# Patient Record
Sex: Female | Born: 1980
Health system: Southern US, Community
[De-identification: ages and names within clinical notes are randomized; demographics above are authoritative.]

## PROBLEM LIST (undated history)

## (undated) DIAGNOSIS — B019 Varicella without complication: Secondary | ICD-10-CM

## (undated) DIAGNOSIS — K219 Gastro-esophageal reflux disease without esophagitis: Secondary | ICD-10-CM

## (undated) DIAGNOSIS — J45909 Unspecified asthma, uncomplicated: Secondary | ICD-10-CM

## (undated) HISTORY — DX: Unspecified asthma, uncomplicated: J45.909

## (undated) HISTORY — DX: Gastro-esophageal reflux disease without esophagitis: K21.9

## (undated) HISTORY — DX: Varicella without complication: B01.9

---

## 2003-08-07 ENCOUNTER — Other Ambulatory Visit: Admission: RE | Admit: 2003-08-07 | Discharge: 2003-08-07 | Payer: Self-pay | Admitting: Obstetrics and Gynecology

## 2004-07-17 ENCOUNTER — Other Ambulatory Visit: Admission: RE | Admit: 2004-07-17 | Discharge: 2004-07-17 | Payer: Self-pay | Admitting: Obstetrics and Gynecology

## 2005-01-28 ENCOUNTER — Other Ambulatory Visit: Admission: RE | Admit: 2005-01-28 | Discharge: 2005-01-28 | Payer: Self-pay | Admitting: Obstetrics and Gynecology

## 2005-07-01 ENCOUNTER — Other Ambulatory Visit: Admission: RE | Admit: 2005-07-01 | Discharge: 2005-07-01 | Payer: Self-pay | Admitting: Obstetrics and Gynecology

## 2005-11-25 ENCOUNTER — Other Ambulatory Visit: Admission: RE | Admit: 2005-11-25 | Discharge: 2005-11-25 | Payer: Self-pay | Admitting: Obstetrics and Gynecology

## 2006-03-24 ENCOUNTER — Ambulatory Visit (HOSPITAL_COMMUNITY): Admission: RE | Admit: 2006-03-24 | Discharge: 2006-03-24 | Payer: Self-pay | Admitting: Obstetrics & Gynecology

## 2006-06-29 ENCOUNTER — Inpatient Hospital Stay (HOSPITAL_COMMUNITY): Admission: AD | Admit: 2006-06-29 | Discharge: 2006-07-03 | Payer: Self-pay | Admitting: Obstetrics and Gynecology

## 2008-11-03 ENCOUNTER — Encounter (INDEPENDENT_AMBULATORY_CARE_PROVIDER_SITE_OTHER): Payer: Self-pay | Admitting: *Deleted

## 2008-12-06 ENCOUNTER — Ambulatory Visit: Payer: Self-pay | Admitting: Family Medicine

## 2009-12-03 ENCOUNTER — Inpatient Hospital Stay (HOSPITAL_COMMUNITY): Admission: RE | Admit: 2009-12-03 | Discharge: 2009-12-06 | Payer: Self-pay | Admitting: Obstetrics and Gynecology

## 2009-12-03 ENCOUNTER — Encounter (INDEPENDENT_AMBULATORY_CARE_PROVIDER_SITE_OTHER): Payer: Self-pay | Admitting: Obstetrics and Gynecology

## 2010-10-15 NOTE — Letter (Signed)
Summary: New Patient Letter  [facility]  [address] [place], [place] [address]   Phone: [phone]  Fax: [phone]       11/03/2008 MRN: [id number]  Angela Cooley [address] [place], [place]  [address]  Dear [name],   Welcome to [facility] and thank you for choosing [place] as your Primary Care Providers. Enclosed you will find information about our practice that we hope you find helpful. We have also enclosed forms to be filled out prior to your visit. This will provide [place] with the necessary information and facilitate your being seen in a timely manner. If you have any questions, please call us at:  [phone]    and we will be happy to assist you. We look forward to seeing you at your scheduled appointment time.  Appointment   12-06-08 @8 :00      with Dr.    Neena Rhymes     Sincerely,  Primary Health Care Team  Please arrive 15 minutes early for your first appointment and bring your insurance card. Co-pay is required at the time of your visit.  *****Please call the office if you are not able to keep this appointment. There is a charge of $50.00 if any appointment is not cancelled or rescheduled within 24 hours.

## 2010-11-15 ENCOUNTER — Encounter: Payer: Self-pay | Admitting: Family Medicine

## 2010-11-15 ENCOUNTER — Ambulatory Visit (INDEPENDENT_AMBULATORY_CARE_PROVIDER_SITE_OTHER): Payer: BC Managed Care – PPO | Admitting: Family Medicine

## 2010-11-15 DIAGNOSIS — K219 Gastro-esophageal reflux disease without esophagitis: Secondary | ICD-10-CM

## 2010-11-15 DIAGNOSIS — R079 Chest pain, unspecified: Secondary | ICD-10-CM | POA: Insufficient documentation

## 2010-11-15 HISTORY — DX: Gastro-esophageal reflux disease without esophagitis: K21.9

## 2010-11-15 HISTORY — DX: Chest pain, unspecified: R07.9

## 2010-11-18 LAB — CONVERTED CEMR LAB

## 2010-11-19 ENCOUNTER — Other Ambulatory Visit: Payer: BC Managed Care – PPO

## 2010-11-25 ENCOUNTER — Other Ambulatory Visit (INDEPENDENT_AMBULATORY_CARE_PROVIDER_SITE_OTHER): Payer: BC Managed Care – PPO

## 2010-11-25 ENCOUNTER — Other Ambulatory Visit: Payer: Self-pay | Admitting: Family Medicine

## 2010-11-25 ENCOUNTER — Encounter (INDEPENDENT_AMBULATORY_CARE_PROVIDER_SITE_OTHER): Payer: Self-pay | Admitting: *Deleted

## 2010-11-25 DIAGNOSIS — K219 Gastro-esophageal reflux disease without esophagitis: Secondary | ICD-10-CM

## 2010-11-26 LAB — H. PYLORI ANTIBODY, IGG: H Pylori IgG: NEGATIVE

## 2010-11-26 NOTE — Assessment & Plan Note (Signed)
Summary: Office Visit: burning stomach after eating   Vital Signs:  Patient profile:   30 year old female Height:      63 inches Weight:      166 pounds Temp:     98.1 degrees F oral Pulse rate:   74 / minute BP sitting:   100 / 62  (left arm)  Vitals Entered By: Jeremy Johann CMA (November 15, 2010 4:21 PM) CC: burning, sour stomach feel when eating certain foods   History of Present Illness: 30 yo woman here today for 'stomach issues'.  started after having 2nd child (3/11).  eggs will particularly upset stomach.  some salads.  will seem to 'flare'.  tried Zantac OTC w/ relief.  attempting weight loss- 'i need to be able to eat these things'.  + nausea, some vomiting.  pain is epigastric, described as a 'burning'.  denies sour brash.  does not change w/ position change.  also reports pain on L chest wall w/ exercise- particularly if there is a lot of jumping or bouncing.  reports her sports bras are old and worn out.  after bouncing will also have pain when she leans forward.  denies pressure, SOB, N/V.  Current Medications (verified): 1)  Zantac 75 75 Mg Tabs (Ranitidine Hcl) .Marland Kitchen.. 1 Tab By Mouth Two Times A Day  Allergies (verified): No Known Drug Allergies  Past History:  Past Medical History: GERD  Review of Systems      See HPI  Physical Exam  General:  Well-developed,well-nourished,in no acute distress; alert,appropriate and cooperative throughout examination Mouth:  Oral mucosa and oropharynx without lesions or exudates.  Teeth in good repair. Neck:  No deformities, masses, or tenderness noted. Chest Wall:  No deformities, masses, or tenderness noted. Lungs:  Normal respiratory effort, chest expands symmetrically. Lungs are clear to auscultation, no crackles or wheezes. Heart:  Normal rate and regular rhythm. S1 and S2 normal without gallop, murmur, click, rub or other extra sounds. Abdomen:  Bowel sounds positive,abdomen soft and non-tender without masses,  organomegaly or hernias noted.   Impression & Recommendations:  Problem # 1:  GERD (ICD-530.81) Assessment New pt's sxs consistent w/ GERD.  gets good relief from Zantac.  was afraid to take meds daily due to packaging info.  discussed lifestyle modifications and reassured her that she was safe to take the meds daily if needed. Her updated medication list for this problem includes:    Zantac 75 75 Mg Tabs (Ranitidine hcl) .Marland Kitchen... 1 tab by mouth two times a day  Orders: Venipuncture (95284) TLB-H. Pylori Abs(Helicobacter Pylori) (86677-HELICO) Specimen Handling (13244)  Problem # 2:  CHEST PAIN (ICD-786.50) Assessment: New pt's pain is most likely pain from breast suspensory ligaments being stretched during exercise.  no concern for cardiac pain.  encouraged better sports bra  Complete Medication List: 1)  Zantac 75 75 Mg Tabs (Ranitidine hcl) .Marland Kitchen.. 1 tab by mouth two times a day  Patient Instructions: 1)  Take the Zanctac daily as directed for heart burn 2)  We'll notify you of your lab results 3)  Treat yourself to a new sports bra! 4)  Ibuprofen for your chest wall pain 5)  Hang in there!!! Prescriptions: ZANTAC 75 75 MG TABS (RANITIDINE HCL) 1 tab by mouth two times a day  #60 x 6   Entered and Authorized by:   Neena Rhymes MD   Signed by:   Neena Rhymes MD on 11/15/2010   Method used:   Print then  Give to Patient   RxID:   [id number]    Orders Added: 1)  Venipuncture [44010] 2)  TLB-H. Pylori Abs(Helicobacter Pylori) [86677-HELICO] 3)  Specimen Handling [99000] 4)  Est. Patient Level III [27253]

## 2010-12-08 LAB — CBC
HCT: 26.7 % — ABNORMAL LOW (ref 36.0–46.0)
HCT: 30.7 % — ABNORMAL LOW (ref 36.0–46.0)
Hemoglobin: 10.4 g/dL — ABNORMAL LOW (ref 12.0–15.0)
Hemoglobin: 9 g/dL — ABNORMAL LOW (ref 12.0–15.0)
MCHC: 33.6 g/dL (ref 30.0–36.0)
MCHC: 34.1 g/dL (ref 30.0–36.0)
MCV: 83.5 fL (ref 78.0–100.0)
MCV: 84.9 fL (ref 78.0–100.0)
Platelets: 155 10*3/uL (ref 150–400)
Platelets: 192 10*3/uL (ref 150–400)
RBC: 3.15 MIL/uL — ABNORMAL LOW (ref 3.87–5.11)
RBC: 3.67 MIL/uL — ABNORMAL LOW (ref 3.87–5.11)
RDW: 14.3 % (ref 11.5–15.5)
RDW: 14.5 % (ref 11.5–15.5)
WBC: 12.1 10*3/uL — ABNORMAL HIGH (ref 4.0–10.5)
WBC: 8.2 10*3/uL (ref 4.0–10.5)

## 2010-12-08 LAB — RPR: RPR Ser Ql: NONREACTIVE

## 2010-12-08 LAB — CCBB MATERNAL DONOR DRAW

## 2011-01-31 NOTE — Discharge Summary (Signed)
NAMEJNIYAH, DANTUONO                  ACCOUNT NO.:  [id number]   MEDICAL RECORD NO.:  [id number]          PATIENT TYPE:  INP   LOCATION:  9126                          FACILITY:  [facility]   PHYSICIAN:  Michelle L. Grewal, M.D.DATE OF BIRTH:  Jan 24, 1981   DATE OF ADMISSION:  06/29/2006  DATE OF DISCHARGE:  07/03/2006                                 DISCHARGE SUMMARY   ADMITTING DIAGNOSES:  1. Intrauterine pregnancy at 41-1/7 weeks estimated gestational age.  2. Two-stage induction of labor.   DISCHARGE DIAGNOSES:  1. Status post low transverse cesarean section secondary to failed vacuum      extractor and arrest of descent.  2. Viable female infant.   PROCEDURE:  Primary low transverse cesarean section.   REASON FOR ADMISSION:  Please see written H&P.  The patient is 30 year old  white married female primigravida that was admitted to [facility] for two-stage induction of labor.  On admission that evening the  patient was administered Cytotec for ripening of the cervix.  She had been  seen in the office and noted to be 2 cm dilated, 90% effaced, vertex at -1  station.  Fetal heart tones were reactive.  On the following morning the  patient was comfortable, fetal heart tones were reactive.  Contractions was  very irregular.  Cervix was now dilated 2.5 cm, 90% effaced, vertex at a -2  station.  Artificial rupture of membranes was performed which revealed clear  fluid.  Pitocin was started to augment her labor and antibiotics were  administered.  Later that morning, the patient did request epidural for her  comfort.  Vital signs were stable.  Fetal heart tones were reactive.  The  fetus did have a small bradycardia after the epidural, however, quickly  reactive.  Cervix was reexamined later that afternoon and was noted to be 4  cm dilated, completely effaced, vertex remaining at a -2 station. A uterine  pressure catheter was placed to adequate follow her labor pattern.   Pitocin  continued.  Later that evening the patient did achieve full dilatation.  After pushing for approximately 2 hours with little gain in station, vacuum  extractor was placed and the vertex was at a +2/+3 station.  Vertex was  noted to be in occiput posterior position.  After several attempts without  pop offs there was no further gain in station.  Decision was made to proceed  with a primary low transverse cesarean section.  The patient was then  transferred to the operating room where epidural was dosed to an adequate  surgical level.  Low transverse incision was made with delivery of a viable  female infant weighing 7 pounds 8 ounces with Apgars of 8 at 1 minute and 9  at 5 minutes.  Arterial cord pH was 7.36.  The patient tolerated the  procedure well and was taken to the recovery room in stable condition.   The following morning the patient did complain of some nausea.  Vital signs  stable.  She was afebrile.  Abdomen soft.  Fundus firm and nontender.  Abdominal dressing was noted to be clean, dry and intact.  Laboratory  findings revealed hemoglobin of 11.2.   On postoperative day #2, the patient was without complaint other than  soreness.  Vital signs were stable.  She was afebrile.  Abdomen soft with  good return of bowel function.  Fundus was firm and nontender.  Abdominal  dressing was noted to be clean, dry and intact.   On postoperative day #3, the patient was without complaint.  Vital signs  remained stable.  Fundus firm and nontender.  Incision was clean, dry and  intact.  Staples removed and patient was later discharged home.   CONDITION ON DISCHARGE:  Good.   DIET:  Regular as tolerated.   ACTIVITY:  No heavy lifting, no driving x2 weeks.  No vaginal entry.   FOLLOW UP:  Patient to follow up in the office in 1 week for an incision  check.  She is to call for temperature greater than 100 degrees, persistent  nausea, vomiting, heavy vaginal bleeding and/or  redness or drainage from  incisional site.   DISCHARGE MEDICATIONS:  1. Tylox #30 one p.o. q.4-6h. p.r.n.  2. Motrin 600 mg every 6 hours.  3. Prenatal vitamins one p.o. daily.  4. Colace one p.o. daily p.r.n.      Julio Sicks, N.P.      Stann Mainland. Vincente Poli, M.D.  Electronically Signed    CC/MEDQ  D:  07/20/2006  T:  07/20/2006  Job:  [id number]

## 2011-01-31 NOTE — Op Note (Signed)
Angela Cooley, Angela Cooley                  ACCOUNT NO.:  [id number]   MEDICAL RECORD NO.:  [id number]          PATIENT TYPE:  INP   LOCATION:  9126                          FACILITY:  [facility]   PHYSICIAN:  Michelle L. Grewal, M.D.DATE OF BIRTH:  1981/01/23   DATE OF PROCEDURE:  06/30/2006  DATE OF DISCHARGE:                                 OPERATIVE REPORT   PREOPERATIVE DIAGNOSIS:  Intrauterine pregnancy at 41 weeks,  occipitoposterior position, failed vacuum and arrest of descent.   POSTOPERATIVE DIAGNOSIS:  Intrauterine pregnancy at 41 weeks,  occipitoposterior position, failed vacuum and arrest of descent.   PROCEDURE:  Primary low transverse cesarean section.   SURGEON:  Michelle L. Vincente Poli, M.D.   ANESTHESIA:  Epidural.   SPECIMENS:  Cord pH 7.36.  Female infant in OP position, Apgar scores 9 at 1  minute and 9 at 5 minutes, weight of 7 pounds 8 ounces, body cord x1.   ESTIMATED BLOOD LOSS:  500 mL.   COMPLICATIONS:  None.   PROCEDURE:  Patient was taken to the operating room.  Her epidural was dosed  and found to be adequate.  She was prepped and draped.  A Foley catheter is  inserted and blood-tinged urine is noted; this was also noted in Labor and  Delivery while the patient was pushing.  A low transverse incision was made  and carried down to the fascia.  The fascia was scored in the midline and  extended laterally.  The rectus muscles were separated in the midline and  extended laterally.  The peritoneum was then stretched.  The bladder blade  was inserted and the lower segment was identified and a low transverse  incision was made in the uterus.  The uterus was entered with a hemostat.  Upon entry into the uterus, the baby's mouth was right that at the incision  and the head was very high.  The baby was in direct OP position and was  delivered quite easily and was noted to have a body cord x1.  The baby was a  female infant, Apgar scores of 9 at 1 minute and 9 at 5 minutes  and weighed  7 pounds 8 ounces.  After cord blood was obtained and cord pH was obtained,  which was 7.36, the placenta was manually removed and noted be intact and  normal with 3-vessel cord.  The uterus was exteriorized and cleared of all  clots and debris.  The uterine incision was closed in 1 layer using 0  chromic in a continuous running-locked stitch.  Hemostasis was excellent.  The uterus was returned to the abdomen.  Irrigation was performed;  hemostasis was again noted.  The peritoneum was closed using 0 Vicryl and  the rectus muscles were reapproximated using the same 0 Vicryl.  The fascia  was closed using 0 Vicryl,  starting at each corner and meeting in the midline using a continuous  running stitch.  After irrigation of the subcutaneous layer, the skin was  closed with staples.  All sponge, lap and instrument counts were correct x2.  The patient  went to recovery room in stable condition.      Michelle L. Vincente Poli, M.D.  Electronically Signed     MLG/MEDQ  D:  06/30/2006  T:  07/02/2006  Job:  [id number]

## 2012-04-26 ENCOUNTER — Other Ambulatory Visit: Payer: Self-pay | Admitting: Obstetrics and Gynecology

## 2012-09-10 ENCOUNTER — Encounter: Payer: Self-pay | Admitting: Family Medicine

## 2012-09-10 ENCOUNTER — Ambulatory Visit (INDEPENDENT_AMBULATORY_CARE_PROVIDER_SITE_OTHER): Payer: BC Managed Care – PPO | Admitting: Family Medicine

## 2012-09-10 VITALS — BP 100/62 | HR 56 | Temp 98.1°F | Ht 62.5 in | Wt 149.9 lb

## 2012-09-10 DIAGNOSIS — R1013 Epigastric pain: Secondary | ICD-10-CM

## 2012-09-10 LAB — CBC WITH DIFFERENTIAL/PLATELET
Basophils Absolute: 0 10*3/uL (ref 0.0–0.1)
Basophils Relative: 1 % (ref 0.0–3.0)
Eosinophils Absolute: 0.3 10*3/uL (ref 0.0–0.7)
Eosinophils Relative: 7.5 % — ABNORMAL HIGH (ref 0.0–5.0)
HCT: 38.8 % (ref 36.0–46.0)
Hemoglobin: 13.1 g/dL (ref 12.0–15.0)
Lymphocytes Relative: 30.5 % (ref 12.0–46.0)
Lymphs Abs: 1.4 10*3/uL (ref 0.7–4.0)
MCHC: 33.7 g/dL (ref 30.0–36.0)
MCV: 84.7 fl (ref 78.0–100.0)
Monocytes Absolute: 0.3 10*3/uL (ref 0.1–1.0)
Monocytes Relative: 7.1 % (ref 3.0–12.0)
Neutro Abs: 2.4 10*3/uL (ref 1.4–7.7)
Neutrophils Relative %: 53.9 % (ref 43.0–77.0)
Platelets: 219 10*3/uL (ref 150.0–400.0)
RBC: 4.58 Mil/uL (ref 3.87–5.11)
RDW: 13.6 % (ref 11.5–14.6)
WBC: 4.4 10*3/uL — ABNORMAL LOW (ref 4.5–10.5)

## 2012-09-10 LAB — AMYLASE: Amylase: 68 U/L (ref 27–131)

## 2012-09-10 LAB — HEPATIC FUNCTION PANEL
ALT: 25 U/L (ref 0–35)
AST: 24 U/L (ref 0–37)
Albumin: 4 g/dL (ref 3.5–5.2)
Alkaline Phosphatase: 56 U/L (ref 39–117)
Bilirubin, Direct: 0 mg/dL (ref 0.0–0.3)
Total Bilirubin: 0.6 mg/dL (ref 0.3–1.2)
Total Protein: 7.1 g/dL (ref 6.0–8.3)

## 2012-09-10 LAB — BASIC METABOLIC PANEL
BUN: 10 mg/dL (ref 6–23)
CO2: 24 mEq/L (ref 19–32)
Calcium: 9.3 mg/dL (ref 8.4–10.5)
Chloride: 104 mEq/L (ref 96–112)
Creatinine, Ser: 0.9 mg/dL (ref 0.4–1.2)
GFR: 79.65 mL/min (ref >60.00)
Glucose, Bld: 95 mg/dL (ref 70–99)
Potassium: 4.2 mEq/L (ref 3.5–5.1)
Sodium: 137 mEq/L (ref 135–145)

## 2012-09-10 LAB — LIPASE: Lipase: 22 U/L (ref 11.0–59.0)

## 2012-09-10 LAB — H. PYLORI ANTIBODY, IGG: H Pylori IgG: NEGATIVE

## 2012-09-10 MED ORDER — OMEPRAZOLE 40 MG PO CPDR: 40.0000 mg | DELAYED_RELEASE_CAPSULE | Freq: Every day | ORAL | Status: DC

## 2012-09-10 NOTE — Assessment & Plan Note (Signed)
New.  Check labs to r/o liver dysfxn, pancreatic abnormality, infxn, h pylori.  Start PPI daily.  Reviewed supportive care and red flags that should prompt return.  Pt expressed understanding and is in agreement w/ plan.

## 2012-09-10 NOTE — Patient Instructions (Addendum)
We'll notify you of your lab results Start the Omeprazole daily Try and avoid spicy or acidic foods Call with any questions or concerns Hang in there! Happy New Year!!

## 2012-09-10 NOTE — Progress Notes (Signed)
  Subjective:    Patient ID: Angela Cooley, female    DOB: 11/15/1980, 31 y.o.   MRN: [id number]  HPI abd pain- 3 weeks of worsening epigastric pain.  Has hx of GERD, was controlled w/ Zantac until recently.  2 weeks ago had 'attack'- 'sour stomach feeling w/ pain and vomiting'.  Had similar episode last week.  sxs are triggered by food- worse w/ fatty foods (pepperoni was the worst).  No associated shoulder pain.  + bloating and full sensation.  No fevers.   Review of Systems For ROS see HPI     Objective:   Physical Exam  Vitals reviewed. Constitutional: She is oriented to person, place, and time. She appears well-developed and well-nourished. No distress.  HENT:  Head: Normocephalic and atraumatic.  Neck: Normal range of motion. Neck supple. No thyromegaly present.  Cardiovascular: Normal rate, regular rhythm, normal heart sounds and intact distal pulses.   Pulmonary/Chest: Effort normal and breath sounds normal. No respiratory distress. She has no wheezes. She has no rales.  Abdominal: Soft. Bowel sounds are normal. She exhibits no distension. There is no tenderness. There is no rebound and no guarding.  Musculoskeletal: She exhibits no edema.  Lymphadenopathy:    She has no cervical adenopathy.  Neurological: She is alert and oriented to person, place, and time.  Skin: Skin is warm and dry.  Psychiatric: She has a normal mood and affect. Her behavior is normal. Thought content normal.          Assessment & Plan:

## 2012-09-16 ENCOUNTER — Telehealth: Payer: Self-pay | Admitting: Family Medicine

## 2012-09-16 DIAGNOSIS — R1013 Epigastric pain: Secondary | ICD-10-CM

## 2012-09-16 NOTE — Telephone Encounter (Signed)
Patient states she is still having pain and would like referral to GI. Pt prefers to see a female. CB# [phone]

## 2012-09-16 NOTE — Telephone Encounter (Signed)
Discuss with patient would like to still go ahead with referral. Pt notes that it not that she is not get better but it seems that her symptoms are getting worse so that is why she would like to see specialist.

## 2012-09-16 NOTE — Telephone Encounter (Signed)
Noted  

## 2012-09-16 NOTE — Telephone Encounter (Signed)
Pt has only been on meds x5 days- it can take longer than this to improve her sxs but we will be happy to refer her to GI (Dr Loreta Ave is the only female locally)

## 2012-09-23 ENCOUNTER — Other Ambulatory Visit: Payer: Self-pay | Admitting: Gastroenterology

## 2012-09-23 DIAGNOSIS — R1013 Epigastric pain: Secondary | ICD-10-CM

## 2012-09-29 ENCOUNTER — Ambulatory Visit
Admission: RE | Admit: 2012-09-29 | Discharge: 2012-09-29 | Disposition: A | Discharge status: Home / Self Care | Payer: BC Managed Care – PPO | Source: Ambulatory Visit | Attending: Gastroenterology | Admitting: Gastroenterology

## 2012-09-29 DIAGNOSIS — R1013 Epigastric pain: Secondary | ICD-10-CM

## 2013-07-13 ENCOUNTER — Other Ambulatory Visit: Payer: Self-pay | Admitting: Obstetrics and Gynecology

## 2013-07-29 IMAGING — US US ABDOMEN COMPLETE
1 series · 14 of 25 positions shown · non-contrast
Comparison: None.

CLINICAL DATA: Epigastric abdominal pain

COMPLETE ABDOMINAL ULTRASOUND

[Series 1: us abdomen complete · 0.24mm/px · 14 of 88 slices shown]
[im 1/88]
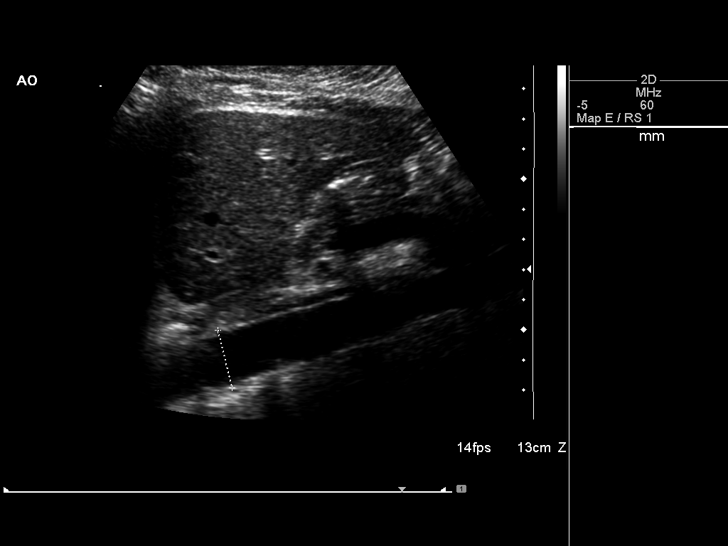
[im 8/88]
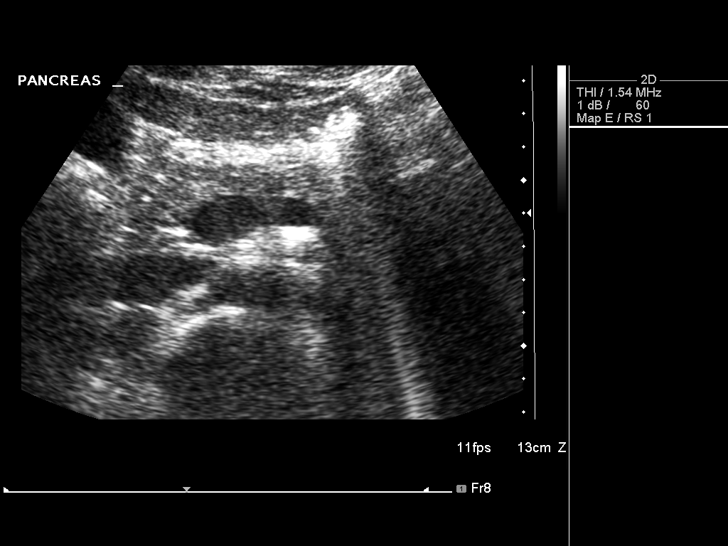
[im 15/88]
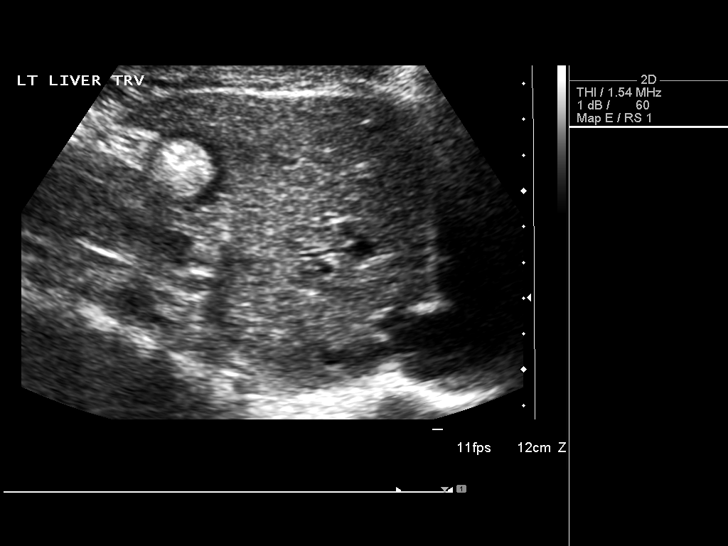
[im 22/88]
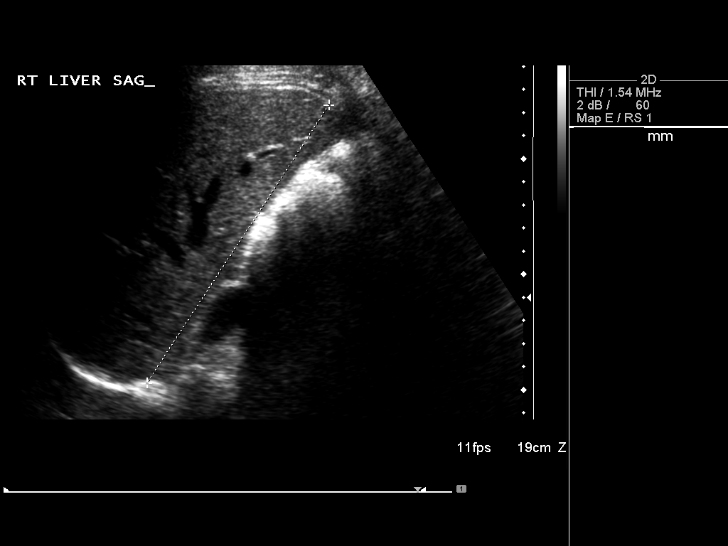
[im 30/88]
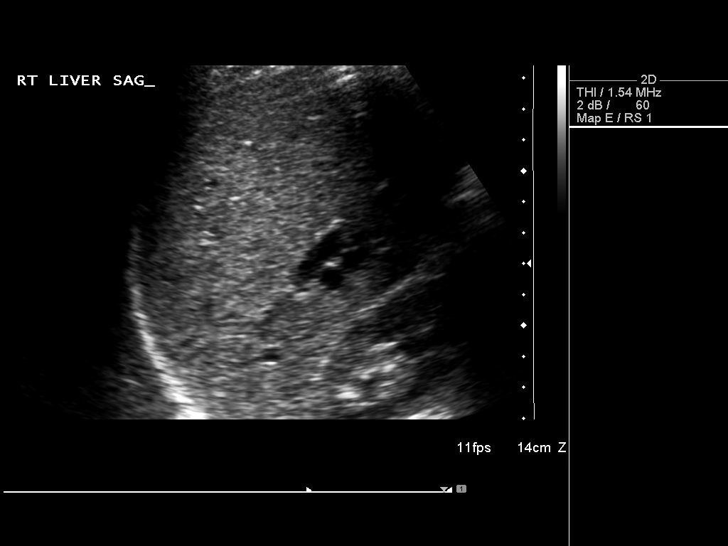
[im 33/88]
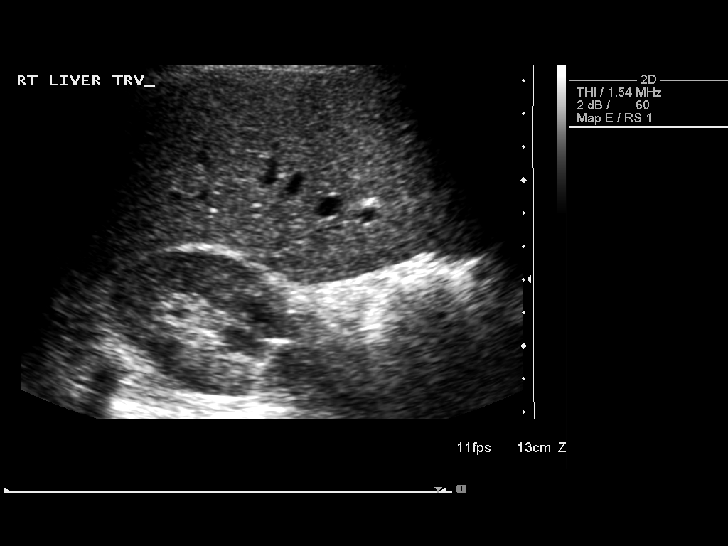
[im 40/88]
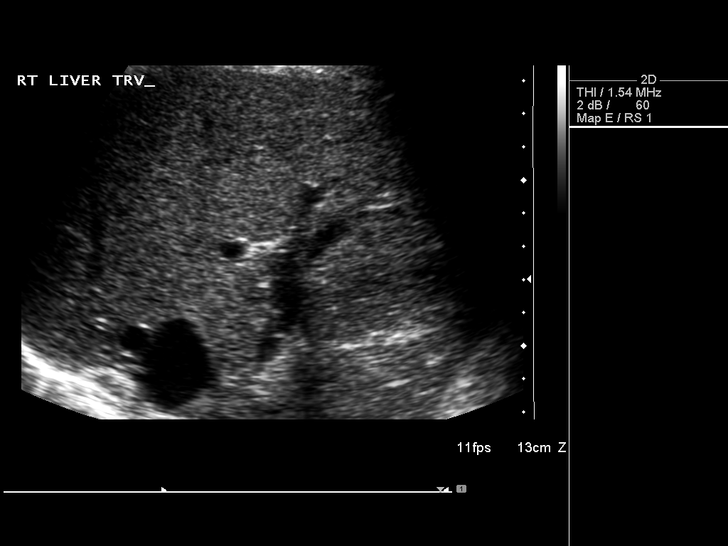
[im 48/88]
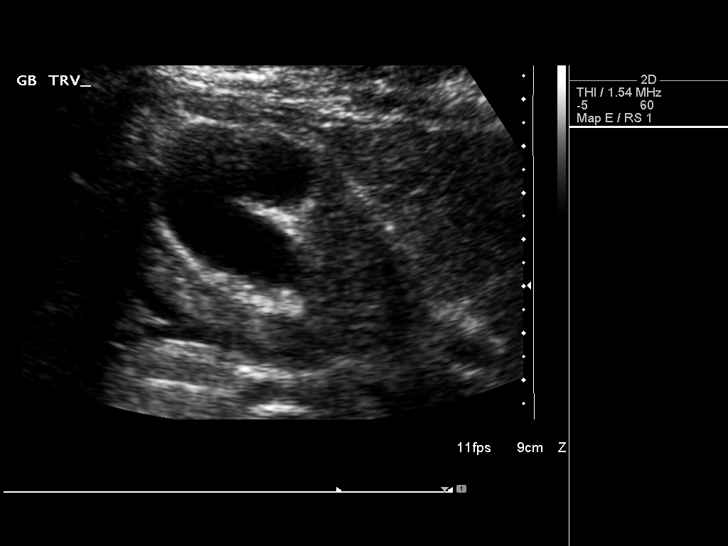
[im 55/88]
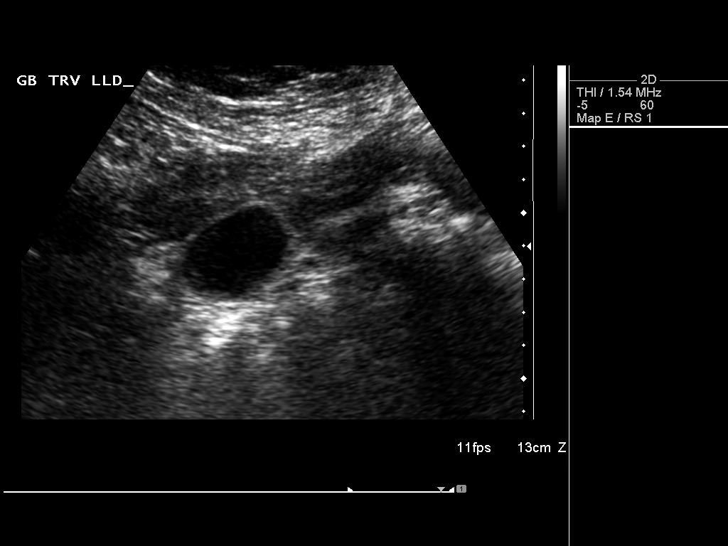
[im 59/88]
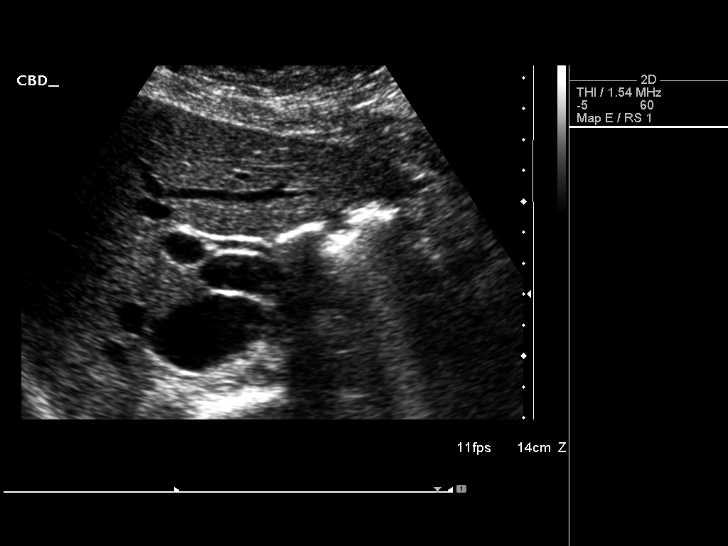
[im 66/88]
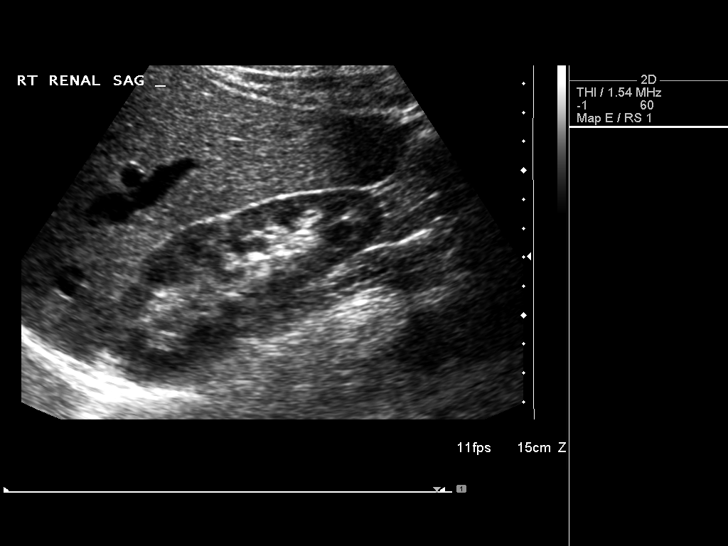
[im 73/88]
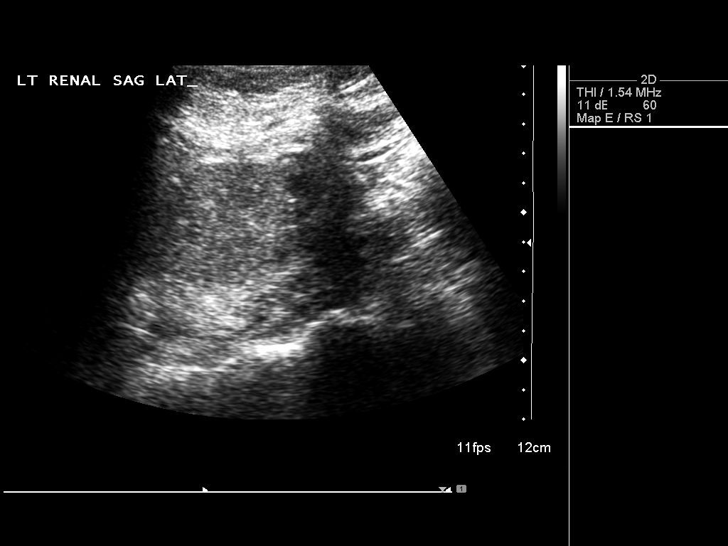
[im 80/88]
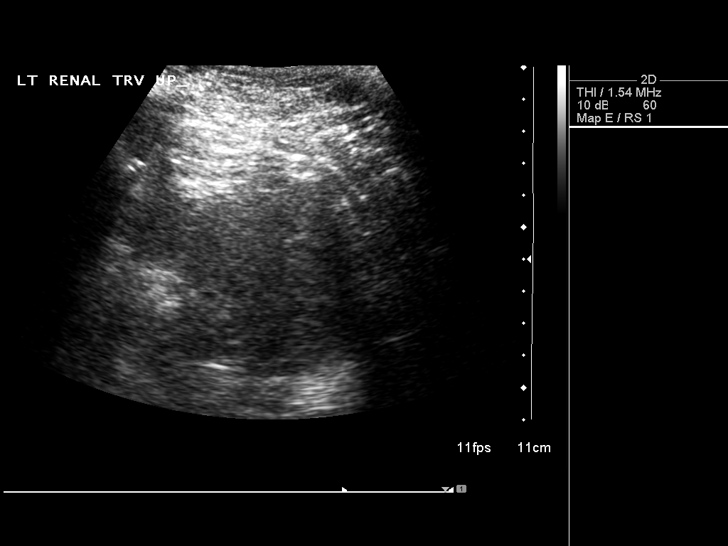
[im 88/88]
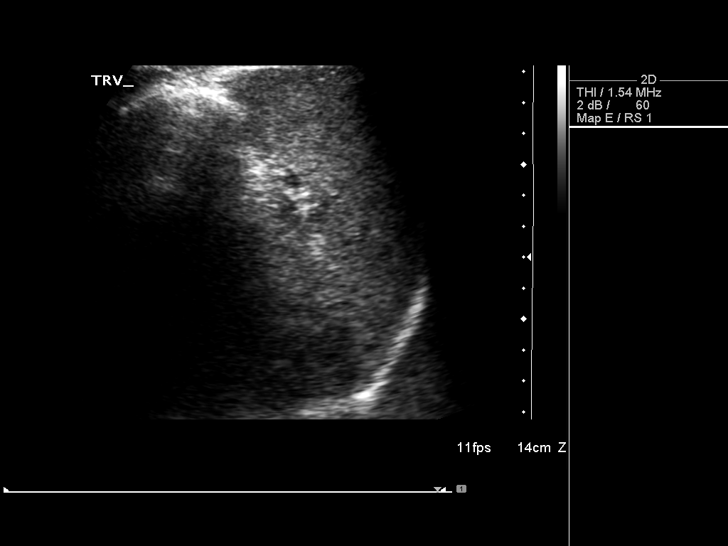

[14 of 25 positions shown; findings below may reference images not displayed]

FINDINGS: Gallbladder:  The gallbladder is visualized and no gallstones are
noted.  There is no pain over gallbladder with compression.

Common bile duct:  The common bile duct is normal measuring 4.6 mm
in diameter.

Liver:  The liver has a normal echogenic pattern.  No ductal
dilatation is seen.

IVC:  Appears normal.

Pancreas:  The pancreas is partially obscured by bowel gas.

Spleen:  The spleen is normal measuring 6.6 cm sagittally.

Right Kidney:  No hydronephrosis is seen.  The right kidney
measures 10.9 cm sagittally.

Left Kidney:  No hydronephrosis is noted.  The left kidney measures
10.8 cm.

Abdominal aorta:  The abdominal aorta is normal in caliber.
IMPRESSION: 1.  No gallstones.  No ductal dilatation.
2.  The pancreas is partially obscured by bowel gas.

## 2014-01-18 ENCOUNTER — Encounter: Payer: Self-pay | Admitting: Family Medicine

## 2014-01-18 ENCOUNTER — Ambulatory Visit (INDEPENDENT_AMBULATORY_CARE_PROVIDER_SITE_OTHER): Payer: BC Managed Care – PPO | Admitting: Family Medicine

## 2014-01-18 VITALS — BP 106/78 | HR 53 | Temp 98.2°F | Resp 16 | Wt 152.4 lb

## 2014-01-18 DIAGNOSIS — J0100 Acute maxillary sinusitis, unspecified: Secondary | ICD-10-CM

## 2014-01-18 MED ORDER — AMOXICILLIN 875 MG PO TABS: 875.0000 mg | ORAL_TABLET | Freq: Two times a day (BID) | ORAL | Status: DC

## 2014-01-18 MED ORDER — PROMETHAZINE-DM 6.25-15 MG/5ML PO SYRP: 5.0000 mL | ORAL_SOLUTION | Freq: Four times a day (QID) | ORAL | Status: DC | PRN

## 2014-01-18 NOTE — Assessment & Plan Note (Signed)
New.  Pt's sxs and PE consistent w/ infxn.  Start abx.  Cough meds prn.  Reviewed supportive care and red flags that should prompt return.  Pt expressed understanding and is in agreement w/ plan.  

## 2014-01-18 NOTE — Progress Notes (Signed)
   Subjective:    Patient ID: Angela Cooley, female    DOB: October 07, 1980, 33 y.o.   MRN: [id number]  Sinusitis Associated symptoms include coughing.  Cough   URI- sxs started ~10 days ago.  No fevers.  + nasal congestion, sinus pressure, sore throat.  + chest congestion w/ cough productive of green sputum.  No ear pain.  + sick contacts.   Review of Systems  Respiratory: Positive for cough.    For ROS see HPI     Objective:   Physical Exam  Vitals reviewed. Constitutional: She appears well-developed and well-nourished. No distress.  HENT:  Head: Normocephalic and atraumatic.  Right Ear: Tympanic membrane normal.  Left Ear: Tympanic membrane normal.  Nose: Mucosal edema and rhinorrhea present. Right sinus exhibits maxillary sinus tenderness. Right sinus exhibits no frontal sinus tenderness. Left sinus exhibits maxillary sinus tenderness. Left sinus exhibits no frontal sinus tenderness.  Mouth/Throat: Uvula is midline and mucous membranes are normal. Posterior oropharyngeal erythema present. No oropharyngeal exudate.  Eyes: Conjunctivae and EOM are normal. Pupils are equal, round, and reactive to light.  Neck: Normal range of motion. Neck supple.  Cardiovascular: Normal rate, regular rhythm and normal heart sounds.   Pulmonary/Chest: Effort normal and breath sounds normal. No respiratory distress. She has no wheezes.  Lymphadenopathy:    She has no cervical adenopathy.          Assessment & Plan:

## 2014-01-18 NOTE — Patient Instructions (Signed)
Follow up as needed Start the Amoxicillin twice daily- take w/ food Drink plenty of fluids Mucinex DM for daytime cough Promethazine cough syrup for night REST! Call with any questions or concerns Happy Mother's Day!!

## 2014-01-18 NOTE — Progress Notes (Signed)
Pre visit review using our clinic review tool, if applicable. No additional management support is needed unless otherwise documented below in the visit note. 

## 2014-11-25 ENCOUNTER — Ambulatory Visit: Payer: BLUE CROSS/BLUE SHIELD | Admitting: Family Medicine

## 2014-11-25 ENCOUNTER — Ambulatory Visit (INDEPENDENT_AMBULATORY_CARE_PROVIDER_SITE_OTHER): Payer: BLUE CROSS/BLUE SHIELD | Admitting: Family

## 2014-11-25 ENCOUNTER — Encounter: Payer: Self-pay | Admitting: Family

## 2014-11-25 VITALS — BP 104/68 | HR 53 | Temp 98.2°F | Wt 150.0 lb

## 2014-11-25 DIAGNOSIS — J0100 Acute maxillary sinusitis, unspecified: Secondary | ICD-10-CM

## 2014-11-25 MED ORDER — AMOXICILLIN-POT CLAVULANATE 875-125 MG PO TABS: 1.0000 | ORAL_TABLET | Freq: Two times a day (BID) | ORAL | Status: DC

## 2014-11-25 NOTE — Progress Notes (Signed)
   Subjective:    Patient ID: Talmage CoinLaura M Harney, female    DOB: Jun 29, 1981, 34 y.o.   MRN: [id number]  Chief Complaint  Patient presents with  . Sinusitis    x 2 weeks--nasal and chest congestion...green/yellow colored sputum...sore throat---facial pain and pressure and headaches--colored mucous in eyes--pt has taken OTC decongestant--with minimal relief--no saline rinse or warm salt gargles--pt states her husband and daughter has been sick as well    HPI:  Talmage CoinLaura M Cutillo is a 34 y.o. female who presents today for an acute visit.   This is a new problem.  Associated symptoms of nasal/chest congestion, productive cough with green/yeelow sputum, facial pressure and headaches has been going on for about 2 weeks. Modifying factors include OTC decongestants, nasal saline sprays and warm salt water gargles which have provided minimal relief. Cold symptoms improved, but the other symptoms have remained.   No Known Allergies  No current outpatient prescriptions on file prior to visit.   No current facility-administered medications on file prior to visit.    Review of Systems  Constitutional: Negative for fever and chills.  HENT: Positive for congestion, facial swelling, sinus pressure and sore throat.   Neurological: Positive for headaches.      Objective:    BP 104/68 mmHg  Pulse 53  Temp(Src) 98.2 F (36.8 C) (Oral)  Wt 150 lb (68.04 kg)  SpO2 98% Nursing note and vital signs reviewed.  Physical Exam  Constitutional: She is oriented to person, place, and time. She appears well-developed and well-nourished. No distress.  HENT:  Right Ear: Hearing, tympanic membrane, external ear and ear canal normal.  Left Ear: Hearing, tympanic membrane, external ear and ear canal normal.  Nose: Right sinus exhibits maxillary sinus tenderness. Right sinus exhibits no frontal sinus tenderness. Left sinus exhibits maxillary sinus tenderness. Left sinus exhibits no frontal sinus tenderness.    Mouth/Throat: Uvula is midline, oropharynx is clear and moist and mucous membranes are normal.  Cardiovascular: Normal rate, regular rhythm, normal heart sounds and intact distal pulses.   Pulmonary/Chest: Effort normal and breath sounds normal.  Neurological: She is alert and oriented to person, place, and time.  Skin: Skin is warm and dry.  Psychiatric: She has a normal mood and affect. Her behavior is normal. Judgment and thought content normal.       Assessment & Plan:

## 2014-11-25 NOTE — Patient Instructions (Addendum)
Thank you for choosing [facility].  Summary/Instructions:  Your prescription(s) have been submitted to your pharmacy or been printed and provided for you. Please take as directed and contact our office if you believe you are having problem(s) with the medication(s) or have any questions.  If your symptoms worsen or fail to improve, please contact our office for further instruction, or in case of emergency go directly to the emergency room at the closest medical facility.   General Recommendations:    Please drink plenty of fluids.  Get plenty of rest   Sleep in humidified air  Use saline nasal sprays  Netti pot   OTC Medications:  Decongestants - helps relieve congestion   Flonase (generic fluticasone) or Nasacort (generic triamcinolone) - please make sure to use the "cross-over" technique at a 45 degree angle towards the opposite eye as opposed to straight up the nasal passageway.   Sudafed (generic pseudoephedrine - Note this is the one that is available behind the pharmacy counter); Products with phenylephrine (-PE) may also be used but is often not as effective as pseudoephedrine.   If you have HIGH BLOOD PRESSURE - Coricidin HBP; AVOID any product that is -D as this contains pseudoephedrine which may increase your blood pressure.  Afrin (oxymetazoline) every 6-8 hours for up to 3 days.   Allergies - helps relieve runny nose, itchy eyes and sneezing   Claritin (generic loratidine), Allegra (fexofenidine), or Zyrtec (generic cyrterizine) for runny nose. These medications should not cause drowsiness.  Note - Benadryl (generic diphenhydramine) may be used however may cause drowsiness  Cough -   Delsym or Robitussin (generic dextromethorphan)  Expectorants - helps loosen mucus to ease removal   Mucinex (generic guaifenesin) as directed on the package.  Headaches / General Aches   Tylenol (generic acetaminophen) - DO NOT EXCEED 3 grams (3,000 mg) in a 24  hour time period  Advil/Motrin (generic ibuprofen)   Sore Throat -   Salt water gargle   Chloraseptic (generic benzocaine) spray or lozenges / Sucrets (generic dyclonine)      Sinusitis Sinusitis is redness, soreness, and inflammation of the paranasal sinuses. Paranasal sinuses are air pockets within the bones of your face (beneath the eyes, the middle of the forehead, or above the eyes). In healthy paranasal sinuses, mucus is able to drain out, and air is able to circulate through them by way of your nose. However, when your paranasal sinuses are inflamed, mucus and air can become trapped. This can allow bacteria and other germs to grow and cause infection. Sinusitis can develop quickly and last only a short time (acute) or continue over a long period (chronic). Sinusitis that lasts for more than 12 weeks is considered chronic.  CAUSES  Causes of sinusitis include: 4. Allergies. 5. Structural abnormalities, such as displacement of the cartilage that separates your nostrils (deviated septum), which can decrease the air flow through your nose and sinuses and affect sinus drainage. 6. Functional abnormalities, such as when the small hairs (cilia) that line your sinuses and help remove mucus do not work properly or are not present. SIGNS AND SYMPTOMS  Symptoms of acute and chronic sinusitis are the same. The primary symptoms are pain and pressure around the affected sinuses. Other symptoms include: 2. Upper toothache. 3. Earache. 4. Headache. 5. Bad breath. 6. Decreased sense of smell and taste. 7. A cough, which worsens when you are lying flat. 8. Fatigue. 9. Fever. 10. Thick drainage from your nose, which often is green   and may contain pus (purulent). 11. Swelling and warmth over the affected sinuses. DIAGNOSIS  Your health care provider will perform a physical exam. During the exam, your health care provider may: 2. Look in your nose for signs of abnormal growths in your nostrils  (nasal polyps). 3. Tap over the affected sinus to check for signs of infection. 4. View the inside of your sinuses (endoscopy) using an imaging device that has a light attached (endoscope). If your health care provider suspects that you have chronic sinusitis, one or more of the following tests may be recommended: 3. Allergy tests. 4. Nasal culture. A sample of mucus is taken from your nose, sent to a lab, and screened for bacteria. 5. Nasal cytology. A sample of mucus is taken from your nose and examined by your health care provider to determine if your sinusitis is related to an allergy. TREATMENT  Most cases of acute sinusitis are related to a viral infection and will resolve on their own within 10 days. Sometimes medicines are prescribed to help relieve symptoms (pain medicine, decongestants, nasal steroid sprays, or saline sprays).  However, for sinusitis related to a bacterial infection, your health care provider will prescribe antibiotic medicines. These are medicines that will help kill the bacteria causing the infection.  Rarely, sinusitis is caused by a fungal infection. In theses cases, your health care provider will prescribe antifungal medicine. For some cases of chronic sinusitis, surgery is needed. Generally, these are cases in which sinusitis recurs more than 3 times per year, despite other treatments. HOME CARE INSTRUCTIONS  3. Drink plenty of water. Water helps thin the mucus so your sinuses can drain more easily. 4. Use a humidifier. 5. Inhale steam 3 to 4 times a day (for example, sit in the bathroom with the shower running). 6. Apply a warm, moist washcloth to your face 3 to 4 times a day, or as directed by your health care provider. 7. Use saline nasal sprays to help moisten and clean your sinuses. 8. Take medicines only as directed by your health care provider. 9. If you were prescribed either an antibiotic or antifungal medicine, finish it all even if you start to feel  better. SEEK IMMEDIATE MEDICAL CARE IF: 6. You have increasing pain or severe headaches. 7. You have nausea, vomiting, or drowsiness. 8. You have swelling around your face. 9. You have vision problems. 10. You have a stiff neck. 11. You have difficulty breathing. MAKE SURE YOU:   Understand these instructions.  Will watch your condition.  Will get help right away if you are not doing well or get worse. Document Released: 09/01/2005 Document Revised: 01/16/2014 Document Reviewed: 09/16/2011 ExitCare Patient Information 2015 [employer]. This information is not intended to replace advice given to you by your health care provider. Make sure you discuss any questions you have with your health care provider.   

## 2014-11-25 NOTE — Assessment & Plan Note (Signed)
Symptoms and exam consistent with maxillary sinusitis. Start Augmentin. Continue over-the-counter medications as needed for symptom relief and supportive care. Follow-up if symptoms worsen or fail to improve. 

## 2014-11-25 NOTE — Progress Notes (Signed)
Pre visit review using our clinic review tool, if applicable. No additional management support is needed unless otherwise documented below in the visit note. 

## 2015-01-24 ENCOUNTER — Other Ambulatory Visit: Payer: Self-pay | Admitting: Obstetrics and Gynecology

## 2015-01-25 LAB — CYTOLOGY - PAP

## 2016-10-21 ENCOUNTER — Ambulatory Visit: Payer: BLUE CROSS/BLUE SHIELD | Admitting: Family Medicine

## 2017-01-01 DIAGNOSIS — R1031 Right lower quadrant pain: Secondary | ICD-10-CM | POA: Diagnosis not present

## 2017-01-27 ENCOUNTER — Ambulatory Visit (INDEPENDENT_AMBULATORY_CARE_PROVIDER_SITE_OTHER): Payer: 59 | Admitting: Physician Assistant

## 2017-01-27 ENCOUNTER — Encounter: Payer: Self-pay | Admitting: Physician Assistant

## 2017-01-27 ENCOUNTER — Other Ambulatory Visit: Payer: Self-pay | Admitting: Physician Assistant

## 2017-01-27 VITALS — BP 104/62 | HR 63 | Temp 98.5°F | Resp 14 | Ht 63.0 in | Wt 155.0 lb

## 2017-01-27 DIAGNOSIS — J029 Acute pharyngitis, unspecified: Secondary | ICD-10-CM | POA: Diagnosis not present

## 2017-01-27 NOTE — Progress Notes (Signed)
   Patient presents to clinic today c/o 5 days of scratchy throat with swollen glands. Notes the past two days were the worst and today she is feeling markedly better. Denies fever, chills, chest pain or SOB. Did recently travel to [place]. Denies sick contacts. Has history of environmental allergies but is not currently taking anything. Has taken Ibuprofen yesterday for throat pain with good relief.   No past medical history on file.  Current Outpatient Prescriptions on File Prior to Visit  Medication Sig Dispense Refill  . DEXILANT 60 MG capsule Take 60 mg by mouth daily.      No current facility-administered medications on file prior to visit.     No Known Allergies  No family history on file.  Social History   Social History  . Marital status: Married    Spouse name: N/A  . Number of children: N/A  . Years of education: N/A   Social History Main Topics  . Smoking status: Never Smoker  . Smokeless tobacco: Not on file  . Alcohol use 0.0 oz/week     Comment: rare  . Drug use: Unknown  . Sexual activity: Not on file   Other Topics Concern  . Not on file   Social History Narrative  . No narrative on file    Review of Systems - See HPI.  All other ROS are negative.  There were no vitals taken for this visit.  Physical Exam  Constitutional: She is oriented to person, place, and time and well-developed, well-nourished, and in no distress.  HENT:  Head: Normocephalic and atraumatic.  Right Ear: Tympanic membrane normal.  Left Ear: Tympanic membrane normal.  Nose: Nose normal. No mucosal edema or rhinorrhea. Right sinus exhibits no maxillary sinus tenderness and no frontal sinus tenderness. Left sinus exhibits no maxillary sinus tenderness and no frontal sinus tenderness.  Mouth/Throat: Uvula is midline, oropharynx is clear and moist and mucous membranes are normal.  Eyes: Conjunctivae are normal.  Neck: Neck supple.  Cardiovascular: Normal rate, regular rhythm,  normal heart sounds and intact distal pulses.   Pulmonary/Chest: Effort normal and breath sounds normal. No respiratory distress. She has no wheezes. She has no rales. She exhibits no tenderness.  Lymphadenopathy:    She has no cervical adenopathy.  Neurological: She is alert and oriented to person, place, and time.  Skin: Skin is warm and dry. No rash noted.  Psychiatric: Affect normal.  Vitals reviewed.  Assessment/Plan: 1. Viral pharyngitis Examination unremarkable. Symptoms improving on their own on day 5. Reviewed supportive measures and OTC medications. No indication for ABX. Discussed typical course of a viral illness. Return precautions reviewed with patient who voices understanding and agreement with the plan.   Piedad ClimesMartin, Lennis Rader Cody, PA-C

## 2017-01-27 NOTE — Patient Instructions (Signed)
Stay hydrated and get plenty of rest. Use tylenol or Ibuprofen if needed for throat pain. Start some saline nasal rinses. Place a humidifier in the bedroom if you have one.  Salt-water gargles will also help with throat pain.  Giving normal examination and improving symptoms, this is most likely viral.  Symptoms should continue to dissipate over the next few days. If you note any worsening symptoms or new symptoms, please call me.    Pharyngitis Pharyngitis is a sore throat (pharynx). There is redness, pain, and swelling of your throat. Follow these instructions at home:  Drink enough fluids to keep your pee (urine) clear or pale yellow.  Only take medicine as told by your doctor.  You may get sick again if you do not take medicine as told. Finish your medicines, even if you start to feel better.  Do not take aspirin.  Rest.  Rinse your mouth (gargle) with salt water ( tsp of salt per 1 qt of water) every 1-2 hours. This will help the pain.  If you are not at risk for choking, you can suck on hard candy or sore throat lozenges. Contact a doctor if:  You have large, tender lumps on your neck.  You have a rash.  You cough up green, yellow-brown, or bloody spit. Get help right away if:  You have a stiff neck.  You drool or cannot swallow liquids.  You throw up (vomit) or are not able to keep medicine or liquids down.  You have very bad pain that does not go away with medicine.  You have problems breathing (not from a stuffy nose). This information is not intended to replace advice given to you by your health care provider. Make sure you discuss any questions you have with your health care provider. Document Released: 02/18/2008 Document Revised: 02/07/2016 Document Reviewed: 05/09/2013 Elsevier Interactive Patient Education  2017 [employer]

## 2017-01-27 NOTE — Progress Notes (Signed)
Pre visit review using our clinic review tool, if applicable. No additional management support is needed unless otherwise documented below in the visit note. 

## 2017-09-07 DIAGNOSIS — N760 Acute vaginitis: Secondary | ICD-10-CM | POA: Diagnosis not present

## 2017-11-04 ENCOUNTER — Encounter: Payer: Self-pay | Admitting: Physician Assistant

## 2017-11-04 ENCOUNTER — Other Ambulatory Visit: Payer: Self-pay

## 2017-11-04 ENCOUNTER — Ambulatory Visit (INDEPENDENT_AMBULATORY_CARE_PROVIDER_SITE_OTHER): Payer: 59 | Admitting: Physician Assistant

## 2017-11-04 VITALS — BP 100/60 | HR 85 | Temp 100.4°F | Resp 16 | Ht 63.0 in | Wt 153.0 lb

## 2017-11-04 DIAGNOSIS — J101 Influenza due to other identified influenza virus with other respiratory manifestations: Secondary | ICD-10-CM

## 2017-11-04 LAB — POC INFLUENZA A&B (BINAX/QUICKVUE)
Influenza A, POC: POSITIVE — AB
Influenza B, POC: NEGATIVE

## 2017-11-04 MED ORDER — OSELTAMIVIR PHOSPHATE 75 MG PO CAPS: 75.0000 mg | ORAL_CAPSULE | Freq: Two times a day (BID) | ORAL | 0 refills | Status: DC

## 2017-11-04 NOTE — Patient Instructions (Signed)
Please stay well-hydrated and get plenty of rest.  Take the Tamiflu as directed. Make sure to keep Dayquil/Nyquil or some Theraflu in your system to help with fever and symptoms.  If you are unable to keep fever < 102 with medications or you note any worsening symptoms, please return ASAP.   Influenza, Adult Influenza ("the flu") is an infection in the lungs, nose, and throat (respiratory tract). It is caused by a virus. The flu causes many common cold symptoms, as well as a high fever and body aches. It can make you feel very sick. The flu spreads easily from person to person (is contagious). Getting a flu shot (influenza vaccination) every year is the best way to prevent the flu. Follow these instructions at home:  Take over-the-counter and prescription medicines only as told by your doctor.  Use a cool mist humidifier to add moisture (humidity) to the air in your home. This can make it easier to breathe.  Rest as needed.  Drink enough fluid to keep your pee (urine) clear or pale yellow.  Cover your mouth and nose when you cough or sneeze.  Wash your hands with soap and water often, especially after you cough or sneeze. If you cannot use soap and water, use hand sanitizer.  Stay home from work or school as told by your doctor. Unless you are visiting your doctor, try to avoid leaving home until your fever has been gone for 24 hours without the use of medicine.  Keep all follow-up visits as told by your doctor. This is important. How is this prevented?  Getting a yearly (annual) flu shot is the best way to avoid getting the flu. You may get the flu shot in late summer, fall, or winter. Ask your doctor when you should get your flu shot.  Wash your hands often or use hand sanitizer often.  Avoid contact with people who are sick during cold and flu season.  Eat healthy foods.  Drink plenty of fluids.  Get enough sleep.  Exercise regularly. Contact a doctor if:  You get new  symptoms.  You have: ? Chest pain. ? Watery poop (diarrhea). ? A fever.  Your cough gets worse.  You start to have more mucus.  You feel sick to your stomach (nauseous).  You throw up (vomit). Get help right away if:  You start to be short of breath or have trouble breathing.  Your skin or nails turn a bluish color.  You have very bad pain or stiffness in your neck.  You get a sudden headache.  You get sudden pain in your face or ear.  You cannot stop throwing up. This information is not intended to replace advice given to you by your health care provider. Make sure you discuss any questions you have with your health care provider. Document Released: 06/10/2008 Document Revised: 02/07/2016 Document Reviewed: 06/26/2015 Elsevier Interactive Patient Education  2017 [employer]

## 2017-11-04 NOTE — Progress Notes (Signed)
   Patient presents to clinic today c/o sudden onset of body aches, chest congestion, nasal congestion and fever. Has notes some chills. Cough is non-productive. Has noted sinus pressure and headache. Has taken Nyquil last night. No medications this morning. Has not had a flu shot this year.   History reviewed. No pertinent past medical history.  Current Outpatient Medications on File Prior to Visit  Medication Sig Dispense Refill  . omeprazole (PRILOSEC) 20 MG capsule Take 20 mg by mouth daily.     No current facility-administered medications on file prior to visit.     No Known Allergies  History reviewed. No pertinent family history.  Social History   Socioeconomic History  . Marital status: Married    Spouse name: None  . Number of children: None  . Years of education: None  . Highest education level: None  Social Needs  . Financial resource strain: None  . Food insecurity - worry: None  . Food insecurity - inability: None  . Transportation needs - medical: None  . Transportation needs - non-medical: None  Occupational History  . None  Tobacco Use  . Smoking status: Never Smoker  . Smokeless tobacco: Never Used  Substance and Sexual Activity  . Alcohol use: Yes    Alcohol/week: 0.0 oz    Comment: rare  . Drug use: No  . Sexual activity: Yes  Other Topics Concern  . None  Social History Narrative  . None   Review of Systems - See HPI.  All other ROS are negative.  BP 100/60   Pulse 85   Temp (!) 100.4 F (38 C) (Oral)   Resp 16   Ht 5\' 3"  (1.6 m)   Wt 153 lb (69.4 kg)   SpO2 98%   BMI 27.10 kg/m   Physical Exam  Constitutional: She is oriented to person, place, and time and well-developed, well-nourished, and in no distress.  HENT:  Head: Normocephalic and atraumatic.  Right Ear: External ear normal.  Left Ear: External ear normal.  Nose: Nose normal.  Mouth/Throat: Oropharynx is clear and moist. No oropharyngeal exudate.  TM within normal limits.   Eyes: Conjunctivae are normal.  Neck: Neck supple.  Cardiovascular: Normal rate, regular rhythm, normal heart sounds and intact distal pulses.  Pulmonary/Chest: Effort normal and breath sounds normal. No respiratory distress. She has no wheezes. She has no rales. She exhibits no tenderness.  Neurological: She is alert and oriented to person, place, and time.  Skin: Skin is warm and dry. No rash noted.  Psychiatric: Affect normal.  Vitals reviewed.  Assessment/Plan: 1. Influenza A + Flu A. Start Tamiflu. Supportive measures and OTC medications reviewed with patient and husband.  - POC Influenza A&B(BINAX/QUICKVUE) - oseltamivir (TAMIFLU) 75 MG capsule; Take 1 capsule (75 mg total) by mouth 2 (two) times daily.  Dispense: 10 capsule; Refill: 0   Piedad ClimesWilliam Cody Leslee Haueter, PA-C

## 2017-12-10 DIAGNOSIS — N760 Acute vaginitis: Secondary | ICD-10-CM | POA: Diagnosis not present

## 2017-12-29 ENCOUNTER — Telehealth: Payer: Self-pay

## 2017-12-29 NOTE — Telephone Encounter (Signed)
Spoke with patient regarding symptoms (scheduled for irregular heart beat).  Patient reports occasional increased heart rate with SOB x 1 month, worsening for the past week.  She recalls 2 episodes in the past week, lasting about 5 minutes. Denies chest pain, diaphoresis, arm or jaw pain, nausea or lightheadedness. She has not increased caffeine, recently decreased carb intake. She reports taking a magnesium supplement that seemed to make episode worse. Encouraged to increase fluid intake and not participate in jog/exercise this evening. Advised to go to ER if symptoms worsen or begins to experience CP and/or SOB. Patient verbalized understanding. Patient is scheduled with PCP on 12/30/17.

## 2017-12-30 ENCOUNTER — Other Ambulatory Visit: Payer: Self-pay

## 2017-12-30 ENCOUNTER — Ambulatory Visit (INDEPENDENT_AMBULATORY_CARE_PROVIDER_SITE_OTHER): Payer: 59 | Admitting: Family Medicine

## 2017-12-30 ENCOUNTER — Encounter: Payer: Self-pay | Admitting: Family Medicine

## 2017-12-30 VITALS — BP 122/78 | HR 68 | Temp 98.0°F | Resp 16 | Ht 63.0 in | Wt 149.0 lb

## 2017-12-30 DIAGNOSIS — R002 Palpitations: Secondary | ICD-10-CM

## 2017-12-30 DIAGNOSIS — K219 Gastro-esophageal reflux disease without esophagitis: Secondary | ICD-10-CM | POA: Diagnosis not present

## 2017-12-30 MED ORDER — PANTOPRAZOLE SODIUM 40 MG PO TBEC: 40.0000 mg | DELAYED_RELEASE_TABLET | Freq: Every day | ORAL | 3 refills | Status: DC

## 2017-12-30 NOTE — Patient Instructions (Addendum)
Follow up as needed or as scheduled We'll notify you of your lab results and make any changes if needed STOP the OTC Omeprazole START the prescription Pantoprazole to decrease acid production Try and limit caffeine as this can worsen palpitations Palpitations are also worse when stressed- try and find an outlet if stress levels are high If labs are normal and symptoms continue- we will do a Holter monitor To help better understand your symptoms, please keep a log indicating when they occur, how long they last, possible associations, etc Call with any questions or concerns Hang in there!  We'll figure this out!

## 2017-12-31 ENCOUNTER — Encounter: Payer: Self-pay | Admitting: General Practice

## 2017-12-31 LAB — BASIC METABOLIC PANEL
BUN: 13 mg/dL (ref 7–25)
CO2: 24 mmol/L (ref 20–32)
Calcium: 9.5 mg/dL (ref 8.6–10.2)
Chloride: 101 mmol/L (ref 98–110)
Creat: 0.74 mg/dL (ref 0.50–1.10)
Glucose, Bld: 86 mg/dL (ref 65–99)
Potassium: 4.1 mmol/L (ref 3.5–5.3)
Sodium: 135 mmol/L (ref 135–146)

## 2017-12-31 LAB — CBC WITH DIFFERENTIAL/PLATELET
Basophils Absolute: 71 cells/uL (ref 0–200)
Basophils Relative: 1.7 %
Eosinophils Absolute: 189 cells/uL (ref 15–500)
Eosinophils Relative: 4.5 %
HCT: 38.6 % (ref 35.0–45.0)
Hemoglobin: 13 g/dL (ref 11.7–15.5)
Lymphs Abs: 1327 cells/uL (ref 850–3900)
MCH: 28.9 pg (ref 27.0–33.0)
MCHC: 33.7 g/dL (ref 32.0–36.0)
MCV: 85.8 fL (ref 80.0–100.0)
MPV: 9.8 fL (ref 7.5–12.5)
Monocytes Relative: 9 %
Neutro Abs: 2234 cells/uL (ref 1500–7800)
Neutrophils Relative %: 53.2 %
Platelets: 234 10*3/uL (ref 140–400)
RBC: 4.5 10*6/uL (ref 3.80–5.10)
RDW: 13.8 % (ref 11.0–15.0)
Total Lymphocyte: 31.6 %
WBC mixed population: 378 cells/uL (ref 200–950)
WBC: 4.2 10*3/uL (ref 3.8–10.8)

## 2017-12-31 LAB — MAGNESIUM: Magnesium: 2 mg/dL (ref 1.5–2.5)

## 2017-12-31 LAB — TSH: TSH: 1.37 mIU/L

## 2017-12-31 NOTE — Progress Notes (Signed)
   Subjective:    Patient ID: Angela Cooley, female    DOB: Feb 03, 1981, 37 y.o.   MRN: [id number]  HPI Palpitations- pt reports she feels that her 'heart pounds and kinda races'.  Had an episode Monday night when heart was racing and 'it kept waking me up'.  Pt had taken a magnesium supplement just prior to bed.  sxs have also occurred w/o magnesium.  sxs started ~1 month ago but has only had 5 episodes.  Had 'funny feeling' after lunch yesterday.  Pt reports tightness in throat over the last 5-6 days- worse after eating w/ slight nausea.  Hx of GERD, taking OTC Omeprazole.  Denies recent GERD- changed diet recently (now doing Keto) and palpitations started around the same time.  Denies SOB- able to exercise w/o difficulty.  Does consume large amounts of caffeine- switched to decaf this week.   Review of Systems For ROS see HPI     Objective:   Physical Exam Constitutional: She is oriented to person, place, and time. She appears well-developed and well-nourished. No distress.  HENT:  Head: Normocephalic and atraumatic.  Eyes: Pupils are equal, round, and reactive to light. Conjunctivae and EOM are normal.  Neck: Normal range of motion. Neck supple. No thyromegaly present.  Cardiovascular: Normal rate, regular rhythm, normal heart sounds and intact distal pulses.  No murmur heard. Pulmonary/Chest: Effort normal and breath sounds normal. No respiratory distress.  Abdominal: Soft. She exhibits no distension. There is no tenderness.  Musculoskeletal: She exhibits no edema.  Lymphadenopathy:    She has no cervical adenopathy.  Neurological: She is alert and oriented to person, place, and time.  Skin: Skin is warm and dry.  Psychiatric: She has a normal mood and affect. Her behavior is normal.  Vitals reviewed.         Assessment & Plan:  Palpitations- new.  Pt reports sxs started ~1 month ago but she has only had 5 episodes.  sxs started around the same time she switched to Keto diet.   She also reports large amounts of caffeine intake.  Pt denies SOB- able to exercise w/o difficulty.  EKG WNL w/ exception of 1 PVC.  Check labs to r/o metabolic cause of palpitations.  If sxs don't improve w/ increased PPI, decreased caffeine, and stress management, will proceed w/ Holter monitor.  Pt encouraged to log sxs.  Reviewed supportive care and red flags that should prompt return.  Pt expressed understanding and is in agreement w/ plan.

## 2017-12-31 NOTE — Assessment & Plan Note (Signed)
Deteriorated.  Pt doesn't feel sxs are GERD related but she has had increased nausea and tightness in throat that is worse after eating.  STOP omeprazole OTC and switch to daily protonix.  Reviewed supportive care and red flags that should prompt return.  Pt expressed understanding and is in agreement w/ plan.

## 2018-01-05 ENCOUNTER — Telehealth: Payer: Self-pay | Admitting: Family Medicine

## 2018-01-05 NOTE — Telephone Encounter (Signed)
Spoke with patient regarding lab results.  She has been made aware and stated verbal understanding.

## 2018-01-05 NOTE — Telephone Encounter (Signed)
Copied from CRM [id number]. Topic: Quick Communication - See Telephone Encounter >> Jan 05, 2018  8:36 AM Eston Mouldavis, Ferman Basilio B wrote: CRM for notification. See Telephone encounter for: 01/05/18.  PT requesting lab results

## 2018-03-05 DIAGNOSIS — Z01419 Encounter for gynecological examination (general) (routine) without abnormal findings: Secondary | ICD-10-CM | POA: Diagnosis not present

## 2018-03-05 DIAGNOSIS — Z6826 Body mass index (BMI) 26.0-26.9, adult: Secondary | ICD-10-CM | POA: Diagnosis not present

## 2018-04-02 ENCOUNTER — Ambulatory Visit (INDEPENDENT_AMBULATORY_CARE_PROVIDER_SITE_OTHER): Payer: 59 | Admitting: Family Medicine

## 2018-04-02 ENCOUNTER — Ambulatory Visit (INDEPENDENT_AMBULATORY_CARE_PROVIDER_SITE_OTHER): Payer: 59

## 2018-04-02 ENCOUNTER — Other Ambulatory Visit: Payer: Self-pay

## 2018-04-02 ENCOUNTER — Encounter: Payer: Self-pay | Admitting: Family Medicine

## 2018-04-02 VITALS — BP 118/70 | HR 64 | Temp 98.2°F | Resp 18 | Ht 63.0 in | Wt 151.2 lb

## 2018-04-02 DIAGNOSIS — R0781 Pleurodynia: Secondary | ICD-10-CM

## 2018-04-02 MED ORDER — PREDNISONE 10 MG PO TABS: ORAL_TABLET | ORAL | 0 refills | Status: DC

## 2018-04-02 NOTE — Progress Notes (Signed)
   Subjective:    Patient ID: Angela Cooley, female    DOB: 1980-12-16, 37 y.o.   MRN: [id number]  HPI Rib pain- R sided, 'I feel like there's something under my R rib'.  Uncomfortable lying on R side.  Noticed a few months ago.  Pt reports 'it's bigger now' and 'sometimes I have pain radiating around to my back'.  Described as a dull ache.  Initially thought it was a pulled muscle but 'it's getting worse'.  Now unable to lie on R side due to discomfort.  No known injury.  No change in activity level- in March was exercising quite heavily.  Not related to food or activity.  'some days it's like it's not even there'.  Has not tried OTC pain relievers.   Review of Systems For ROS see HPI     Objective:   Physical Exam  Constitutional: She appears well-developed and well-nourished. She does not appear ill. No distress.  HENT:  Head: Normocephalic and atraumatic.  Eyes: Pupils are equal, round, and reactive to light. EOM are normal.  Pulmonary/Chest: Effort normal and breath sounds normal. No respiratory distress. She has no decreased breath sounds. She has no wheezes. She exhibits tenderness (TTP over R false ribs). She exhibits no mass, no edema, no deformity and no swelling.    Abdominal: Soft. Bowel sounds are normal. She exhibits no distension. There is no hepatomegaly. There is no tenderness.  Vitals reviewed.         Assessment & Plan:  R rib pain- new.  Pt's pain is localized over lower false ribs and their cartilaginous attachments.  Get rib xray to assess positioning and start Prednisone taper.  If no improvement in pain, will get US or other imaging to further assess.  Pt expressed understanding and is in agreement w/ plan.

## 2018-04-02 NOTE — Patient Instructions (Signed)
Follow up by phone or MyChart if no improvement in rib pain after Prednisone Go to [facility] ([address]) and get your xray at your convenience START the Prednisone as directed- 3 tabs x3 days, then 2 tabs x3 days, then 1 tab daily.  Take w/ food ICE as needed for pain relief Call with any questions or concerns Have a great weekend!

## 2018-06-02 DIAGNOSIS — R1011 Right upper quadrant pain: Secondary | ICD-10-CM | POA: Diagnosis not present

## 2018-06-02 DIAGNOSIS — K5900 Constipation, unspecified: Secondary | ICD-10-CM | POA: Diagnosis not present

## 2018-06-02 DIAGNOSIS — R82998 Other abnormal findings in urine: Secondary | ICD-10-CM | POA: Diagnosis not present

## 2018-06-02 DIAGNOSIS — N760 Acute vaginitis: Secondary | ICD-10-CM | POA: Diagnosis not present

## 2018-08-17 ENCOUNTER — Encounter: Payer: 59 | Admitting: Family Medicine

## 2018-09-09 ENCOUNTER — Ambulatory Visit: Payer: Self-pay | Admitting: *Deleted

## 2018-09-09 NOTE — Telephone Encounter (Signed)
Patient reports she is not feeling well- random nauseous heaviness- fast heart rate. Sometimes weakness. Patient reports she felt this in November the first time. This past week- she reports a strange sensation- tightness in chest,tired and nausea. P 76 Call to office- they are going to talk to patient- to see if she really needs to go to ED- or if she can wait and see provider in office.  Reason for Disposition . Dizziness or lightheadedness    Patient is having multiple symptoms- she states they have been increasing since November- no dizziness now- but has had in the past- patient has also had multiple long trips. No pain with deep breath. Call to office to see if patient needs to be seen in ED- or if she can be seen in office- she has young child and husband is out of town.  Answer Assessment - Initial Assessment Questions 1. LOCATION: "Where does it hurt?"       Tightness- in center of chest- not pain 2. RADIATION: "Does the pain go anywhere else?" (e.g., into neck, jaw, arms, back)     Does not radiate 3. ONSET: "When did the chest pain begin?" (Minutes, hours or days)      November- first felt it, last few days- feels racing feeling 4. PATTERN "Does the pain come and go, or has it been constant since it started?"  "Does it get worse with exertion?"      Comes and goes,- lasting anywhere from seconds to few minutes, no does not get worse with exertion- more at rest 5. DURATION: "How long does it last" (e.g., seconds, minutes, hours)     Seconds to minutes 6. SEVERITY: "How bad is the pain?"  (e.g., Scale 1-10; mild, moderate, or severe)    - MILD (1-3): doesn't interfere with normal activities     - MODERATE (4-7): interferes with normal activities or awakens from sleep    - SEVERE (8-10): excruciating pain, unable to do any normal activities       No pain 7. CARDIAC RISK FACTORS: "Do you have any history of heart problems or risk factors for heart disease?" (e.g., prior heart attack,  angina; high blood pressure, diabetes, being overweight, high cholesterol, smoking, or strong family history of heart disease)     Family history- EKG normal 8. PULMONARY RISK FACTORS: "Do you have any history of lung disease?"  (e.g., blood clots in lung, asthma, emphysema, birth control pills)     no 9. CAUSE: "What do you think is causing the chest pain?"     Unknown- food , anxiety  10. OTHER SYMPTOMS: "Do you have any other symptoms?" (e.g., dizziness, nausea, vomiting, sweating, fever, difficulty breathing, cough)       Dizzy, nausea 11. PREGNANCY: "Is there any chance you are pregnant?" "When was your last menstrual period?"       n/a  Protocols used: CHEST PAIN-A-AH

## 2018-09-09 NOTE — Telephone Encounter (Signed)
Spoke with pt, she advised that she did have some increased wine consumption with the holidays. She did advise that she changed to OTC nexium to treat her Reflux. Pt was advised of PCP recommendations to double the nexium, take maalox or tums and if symptoms change or worsen to seek treatment at ED.   Pt stated an understanding and was scheduled an appt with PCP at 9:45 in the morning.

## 2018-09-10 ENCOUNTER — Ambulatory Visit (INDEPENDENT_AMBULATORY_CARE_PROVIDER_SITE_OTHER): Payer: 59 | Admitting: Family Medicine

## 2018-09-10 ENCOUNTER — Encounter: Payer: Self-pay | Admitting: Family Medicine

## 2018-09-10 VITALS — BP 102/80 | HR 63 | Temp 98.0°F | Resp 15 | Ht 63.0 in | Wt 147.0 lb

## 2018-09-10 DIAGNOSIS — K219 Gastro-esophageal reflux disease without esophagitis: Secondary | ICD-10-CM

## 2018-09-10 DIAGNOSIS — R0789 Other chest pain: Secondary | ICD-10-CM

## 2018-09-10 LAB — H. PYLORI ANTIBODY, IGG: H Pylori IgG: NEGATIVE

## 2018-09-10 MED ORDER — PANTOPRAZOLE SODIUM 40 MG PO TBEC: 40.0000 mg | DELAYED_RELEASE_TABLET | Freq: Every day | ORAL | 3 refills | Status: DC

## 2018-09-10 NOTE — Progress Notes (Signed)
   Subjective:    Patient ID: Angela Cooley, female    DOB: 1981-06-29, 37 y.o.   MRN: [id number]  HPI Chest tightness- pt was having issues yesterday and when she called, I instructed her to increase her PPI to 2 OTC Nexium yesterday.  This improved her sxs.  Has had some associated nausea.  sxs are triggered by avocados, nuts, and others.  She has had 2 episodes of dizziness- last occurred earlier this week.  Admits to being exhausted with the holidays, going 'a million miles an hour'.   Review of Systems For ROS see HPI     Objective:   Physical Exam Vitals signs reviewed.  Constitutional:      General: She is not in acute distress.    Appearance: She is well-developed.  HENT:     Head: Normocephalic and atraumatic.  Eyes:     Conjunctiva/sclera: Conjunctivae normal.     Pupils: Pupils are equal, round, and reactive to light.  Neck:     Musculoskeletal: Normal range of motion and neck supple.     Thyroid: No thyromegaly.  Cardiovascular:     Rate and Rhythm: Normal rate and regular rhythm.     Heart sounds: Normal heart sounds. No murmur.  Pulmonary:     Effort: Pulmonary effort is normal. No respiratory distress.     Breath sounds: Normal breath sounds.  Abdominal:     General: There is no distension.     Palpations: Abdomen is soft.     Tenderness: There is no abdominal tenderness.  Lymphadenopathy:     Cervical: No cervical adenopathy.  Skin:    General: Skin is warm and dry.  Neurological:     Mental Status: She is alert and oriented to person, place, and time.  Psychiatric:        Behavior: Behavior normal.           Assessment & Plan:  Chest tightness- new.  No red flags on hx or PE.  Very low risk for cardiac issues.  Suspect this is GERD and anxiety related.  EKG WNL.  Sxs improved w/ increased dose of Nexium yesterday.  Check H pylori.  Switch to prescription PPI.  Again discussed dietary and lifestyle modification to improve sxs.  Will follow.

## 2018-09-10 NOTE — Patient Instructions (Signed)
Follow up as needed or as scheduled We'll notify you of your lab results and make any changes if needed START the Pantoprazole 40mg  daily- this is in place of Nexium or Omeprazole Try and avoid late night eating as this can worsen reflux Alcohol, caffeine, rich, or spicy foods can worsen reflux Make sure you are drinking LOTS of water REST!  The holidays are exhausting! Call with any questions or concerns Happy New Year!

## 2018-09-10 NOTE — Assessment & Plan Note (Signed)
Deteriorated.  Switch to prescription strength PPI.

## 2018-10-05 ENCOUNTER — Other Ambulatory Visit: Payer: Self-pay

## 2018-10-05 ENCOUNTER — Encounter: Payer: Self-pay | Admitting: Family Medicine

## 2018-10-05 ENCOUNTER — Ambulatory Visit (INDEPENDENT_AMBULATORY_CARE_PROVIDER_SITE_OTHER): Payer: 59 | Admitting: Family Medicine

## 2018-10-05 VITALS — BP 124/82 | HR 64 | Temp 97.8°F | Resp 16 | Ht 63.0 in | Wt 148.2 lb

## 2018-10-05 DIAGNOSIS — F410 Panic disorder [episodic paroxysmal anxiety] without agoraphobia: Secondary | ICD-10-CM

## 2018-10-05 DIAGNOSIS — R1011 Right upper quadrant pain: Secondary | ICD-10-CM | POA: Diagnosis not present

## 2018-10-05 DIAGNOSIS — R002 Palpitations: Secondary | ICD-10-CM

## 2018-10-05 DIAGNOSIS — K582 Mixed irritable bowel syndrome: Secondary | ICD-10-CM | POA: Diagnosis not present

## 2018-10-05 LAB — CBC WITH DIFFERENTIAL/PLATELET
Basophils Absolute: 0 10*3/uL (ref 0.0–0.1)
Basophils Relative: 0.8 % (ref 0.0–3.0)
Eosinophils Absolute: 0.1 10*3/uL (ref 0.0–0.7)
Eosinophils Relative: 0.9 % (ref 0.0–5.0)
HCT: 43.8 % (ref 36.0–46.0)
Hemoglobin: 14.7 g/dL (ref 12.0–15.0)
Lymphocytes Relative: 21.5 % (ref 12.0–46.0)
Lymphs Abs: 1.3 10*3/uL (ref 0.7–4.0)
MCHC: 33.6 g/dL (ref 30.0–36.0)
MCV: 88.7 fl (ref 78.0–100.0)
Monocytes Absolute: 0.5 10*3/uL (ref 0.1–1.0)
Monocytes Relative: 8 % (ref 3.0–12.0)
Neutro Abs: 4 10*3/uL (ref 1.4–7.7)
Neutrophils Relative %: 68.8 % (ref 43.0–77.0)
Platelets: 242 10*3/uL (ref 150.0–400.0)
RBC: 4.93 Mil/uL (ref 3.87–5.11)
RDW: 13.8 % (ref 11.5–15.5)
WBC: 5.9 10*3/uL (ref 4.0–10.5)

## 2018-10-05 LAB — COMPREHENSIVE METABOLIC PANEL
ALT: 22 U/L (ref 0–35)
AST: 20 U/L (ref 0–37)
Albumin: 4.7 g/dL (ref 3.5–5.2)
Alkaline Phosphatase: 51 U/L (ref 39–117)
BUN: 12 mg/dL (ref 6–23)
CO2: 27 mEq/L (ref 19–32)
Calcium: 10.1 mg/dL (ref 8.4–10.5)
Chloride: 103 mEq/L (ref 96–112)
Creatinine, Ser: 0.86 mg/dL (ref 0.40–1.20)
GFR: 73.39 mL/min (ref >60.00)
Glucose, Bld: 89 mg/dL (ref 70–99)
Potassium: 4.5 mEq/L (ref 3.5–5.1)
Sodium: 139 mEq/L (ref 135–145)
Total Bilirubin: 0.9 mg/dL (ref 0.2–1.2)
Total Protein: 7.3 g/dL (ref 6.0–8.3)

## 2018-10-05 LAB — TSH: TSH: 1.71 u[IU]/mL (ref 0.35–4.50)

## 2018-10-05 MED ORDER — ALPRAZOLAM 0.5 MG PO TABS: 0.5000 mg | ORAL_TABLET | Freq: Every day | ORAL | 0 refills | Status: DC | PRN

## 2018-10-05 NOTE — Progress Notes (Signed)
Subjective     CC:  Chief Complaint  Patient presents with  . Palpitations    She states that she had similar episode in Dec. They are happening more often. Reports lip tingling and nausea. She states that the episodes vary in length of time frame   Same day acute visit; PCP not available. New pt to me. Chart reviewed.   HPI: Angela Cooley is a 38 y.o. female who presents to the office today to address the problems listed above in the chief complaint.  38 year old healthy female with history of GERD presents for worsening constellation of symptoms that include palpitations, feeling of impending doom, paresthesias in hands and feet, vague fullness or pressure sensation in the right upper quadrant and increased worry about the symptoms.  She is very tearful today during her interview.  Her symptoms started back in November.  She had a few more episodes and was seen in the end of December with her primary care doctor.  Symptoms at that time were thought to be related to anxiety and/or GERD.  Since, however, she has had almost daily symptoms with intermittent worsening symptoms that are consistent with panic attacks.  She lives a busy lifestyle.  She is a full-time mother of 2, works full-time, married.  She denies any history of mood or anxiety problems.  She does not feel stressed or overwhelmed.  She does have chronic IBS that is currently active as well.  She is very concerned about her health: She is worries about hormonal imbalances, problem in her right upper abdomen, heart problems.  Review of systems is negative for chest pain, shortness of breath, neurologic deficits, fevers, chills, urinary symptoms, changes in bowel habits, weight loss, change in appetite. Assessment  1. Panic attack   2. Palpitations   3. Irritable bowel syndrome with both constipation and diarrhea   4. RUQ abdominal pain      Plan   Symptom complex most consistent with panic attacks and/or chronic anxiety: Spent  a significant time discussing possibilities and etiologies for her current symptoms.  Patient to return home and monitor her symptoms and her mood/anxiety/stress levels.  Work on [employer].  Xanax prescribed to be used if she has another panic attack.  She will see how she does with this and then follow-up with her primary care doctor.  They then consider some SSRI daily medications if indicated.  In the meantime, blood work was ordered to rule out other problems.  Right upper quadrant ultrasound ordered given her pain in the right upper quadrant, although it is vague.  IBS could be a factor as well.  She had a normal EKG at her last visit.  Heart exam is normal today.  Recheck at next visit.  If palpitations persist can consider a Holter monitor.  I tried to reassure patient.  Close follow-up recommended  Follow up: Return in about 4 weeks (around 11/02/2018) for recheck.  01/13/2019  Orders Placed This Encounter  Procedures  . US ABDOMEN LIMITED RUQ  . CBC with Differential/Platelet  . TSH  . Comprehensive metabolic panel   Meds ordered this encounter  Medications  . ALPRAZolam (XANAX) 0.5 MG tablet    Sig: Take 1 tablet (0.5 mg total) by mouth daily as needed for anxiety (or panic symptoms).    Dispense:  20 tablet    Refill:  0      I reviewed the patients updated PMH, FH, and SocHx.    Patient Active Problem  List   Diagnosis Date Noted  . GERD 11/15/2010  . CHEST PAIN 11/15/2010   Current Meds  Medication Sig  . pantoprazole (PROTONIX) 40 MG tablet Take 1 tablet (40 mg total) by mouth daily.    Allergies: Patient has No Known Allergies. Family History: Patient family history is not on file. Social History:  Patient  reports that she has never smoked. She has never used smokeless tobacco. She reports current alcohol use. She reports that she does not use drugs.  Review of Systems: Constitutional: Negative for fever malaise or anorexia Cardiovascular: negative  for chest pain Respiratory: negative for SOB or persistent cough Gastrointestinal: negative for abdominal pain  Objective  Vitals: BP 124/82   Pulse 64   Temp 97.8 F (36.6 C) (Oral)   Resp 16   Ht 5\' 3"  (1.6 m)   Wt 148 lb 3.2 oz (67.2 kg)   LMP 09/20/2018   SpO2 99%   BMI 26.25 kg/m  General: no acute distress , A&Ox3 HEENT: PEERL, conjunctiva normal, Oropharynx moist,neck is supple Cardiovascular:  RRR without murmur or gallop.  Respiratory:  Good breath sounds bilaterally, CTAB with normal respiratory effort Gastrointestinal: soft, flat abdomen, normal active bowel sounds, no palpable masses, no hepatosplenomegaly, no appreciated hernias Psych: Tearful, anxious appearing, alert and oriented x3 Skin:  Warm, no rashes  No visits with results within 1 Day(s) from this visit.  Latest known visit with results is:  Office Visit on 09/10/2018  Component Date Value Ref Range Status  . H Pylori IgG 09/10/2018 Negative  Negative Final   Lab Results  Component Value Date   CREATININE 0.74 12/30/2017   BUN 13 12/30/2017   NA 135 12/30/2017   K 4.1 12/30/2017   CL 101 12/30/2017   CO2 24 12/30/2017   Lab Results  Component Value Date   TSH 1.37 12/30/2017      Commons side effects, risks, benefits, and alternatives for medications and treatment plan prescribed today were discussed, and the patient expressed understanding of the given instructions. Patient is instructed to call or message via MyChart if he/she has any questions or concerns regarding our treatment plan. No barriers to understanding were identified. We discussed Red Flag symptoms and signs in detail. Patient expressed understanding regarding what to do in case of urgent or emergency type symptoms.   Medication list was reconciled, printed and provided to the patient in AVS. Patient instructions and summary information was reviewed with the patient as documented in the AVS. This note was prepared with assistance  of Dragon voice recognition software. Occasional wrong-word or sound-a-like substitutions may have occurred due to the inherent limitations of voice recognition software

## 2018-10-05 NOTE — Patient Instructions (Signed)
Please return in 3-4 weeks with Dr. Birdie Riddle to recheck your symptoms and how you are doing on the medication.  Today I am checking your blood work and have ordered an ultrasound of your upper right abdomen. We will call you with information regarding the appointment.   Use the xanax as needed for panic symptoms.   If you have any questions or concerns, please don't hesitate to send me a message via MyChart or call the office at [phone]. Thank you for visiting with [place] today! It's our pleasure caring for you.   Stress Stress is a normal reaction to life events. Stress is what you feel when life demands more than you are used to, or more than you think you can handle. Some stress can be useful, such as studying for a test or meeting a deadline at work. Stress that occurs too often or for too long can cause problems. It can affect your emotional health and interfere with relationships and normal daily activities. Too much stress can weaken your body's defense system (immune system) and increase your risk for physical illness. If you already have a medical problem, stress can make it worse. What are the causes? All sorts of life events can cause stress. An event that causes stress for one person may not be stressful for another person. Major life events, whether positive or negative, commonly cause stress. Examples include:  Losing a job or starting a new job.  Losing a loved one.  Moving to a new town or home.  Getting married or divorced.  Having a baby.  Injury or illness. Less obvious life events can also cause stress, especially if they occur day after day or in combination with each other. Examples include:  Working long hours.  Driving in traffic.  Caring for children.  Being in debt.  Being in a difficult relationship. What are the signs or symptoms? Stress can cause emotional symptoms, including:  Anxiety. This is feeling worried, afraid, on edge, overwhelmed, or out of  control.  Anger, including irritation or impatience.  Depression. This is feeling sad, down, helpless, or guilty.  Trouble focusing, remembering, or making decisions. Stress can cause physical symptoms, including:  Aches and pains. These may affect your head, neck, back, stomach, or other areas of your body.  Tight muscles or a clenched jaw.  Low energy.  Trouble sleeping. Stress can cause unhealthy behaviors, including:  Eating to feel better (overeating) or skipping meals.  Working too much or putting off tasks.  Smoking, drinking alcohol, or using drugs to feel better. How is this diagnosed? Stress is diagnosed through an assessment by your health care provider. He or she may diagnose this condition based on:  Your symptoms and any stressful life events.  Your medical history.  Tests to rule out other causes of your symptoms. Depending on your condition, your health care provider may refer you to a specialist for further evaluation. How is this treated?  Stress management techniques are the recommended treatment for stress. Medicine is not typically recommended for the treatment of stress. Techniques to reduce your reaction to stressful life events include:  Stress identification. Monitor yourself for symptoms of stress and identify what causes stress for you. These skills may help you to avoid or prepare for stressful events.  Time management. Set your priorities, keep a calendar of events, and learn to say "no." Taking these actions can help you avoid making too many commitments. Techniques for coping with stress include:  Rethinking  the problem. Try to think realistically about stressful events rather than ignoring them or overreacting. Try to find the positives in a stressful situation rather than focusing on the negatives.  Exercise. Physical exercise can release both physical and emotional tension. The key is to find a form of exercise that you enjoy and do it  regularly.  Relaxation techniques. These relax the body and mind. The key is to find one or more that you enjoy and use the technique(s) regularly. Examples include: ? Meditation, deep breathing, or progressive relaxation techniques. ? Yoga or tai chi. ? Biofeedback, mindfulness techniques, or journaling. ? Listening to music, being out in nature, or participating in other hobbies.  Practicing a healthy lifestyle. Eat a balanced diet, drink plenty of water, limit or avoid caffeine, and get plenty of sleep.  Having a strong support network. Spend time with family, friends, or other people you enjoy being around. Express your feelings and talk things over with someone you trust. Counseling or talk therapy with a mental health professional may be helpful if you are having trouble managing stress on your own. Follow these instructions at home: Lifestyle   Avoid drugs.  Do not use any products that contain nicotine or tobacco, such as cigarettes and e-cigarettes. If you need help quitting, ask your health care provider.  Limit alcohol intake to no more than 1 drink a day for nonpregnant women and 2 drinks a day for men. One drink equals 12 oz of beer, 5 oz of wine, or 1 oz of hard liquor.  Do not use alcohol or drugs to relax.  Eat a balanced diet that includes fresh fruits and vegetables, whole grains, lean meats, fish, eggs, and beans, and low-fat dairy. Avoid processed foods and foods high in added fat, sugar, and salt.  Exercise at least 30 minutes on 5 or more days each week.  Get 7-8 hours of sleep each night. General instructions   Practice stress management techniques as discussed with your health care provider.  Drink enough fluid to keep your urine clear or pale yellow.  Take over-the-counter and prescription medicines only as told by your health care provider.  Keep all follow-up visits as told by your health care provider. This is important. Contact a health care  provider if:  Your symptoms get worse.  You have new symptoms.  You feel overwhelmed by your problems and can no longer manage them on your own. Get help right away if:  You have thoughts of hurting yourself or others. If you ever feel like you may hurt yourself or others, or have thoughts about taking your own life, get help right away. You can go to your nearest emergency department or call:  Your local emergency services (911 in the [place]  A suicide crisis helpline, such as the [facility] at 954-484-6591. This is open 24 hours a day. Summary  Stress is a normal reaction to life events. It can cause problems if it happens too often or for too long.  Practicing stress management techniques is the best way to treat stress.  Counseling or talk therapy with a mental health professional may be helpful if you are having trouble managing stress on your own. This information is not intended to replace advice given to you by your health care provider. Make sure you discuss any questions you have with your health care provider. Document Released: 02/25/2001 Document Revised: 10/22/2016 Document Reviewed: 10/22/2016 Elsevier Interactive Patient Education  2019 [employer].  Panic Attack A panic attack is a sudden episode of severe anxiety, fear, or discomfort that causes physical and emotional symptoms. The attack may be in response to something frightening, or it may occur for no known reason. Symptoms of a panic attack can be similar to symptoms of a heart attack or stroke. It is important to see your health care provider when you have a panic attack so that these conditions can be ruled out. A panic attack is a symptom of another condition. Most panic attacks go away with treatment of the underlying problem. If you have panic attacks often, you may have a condition called panic disorder. What are the causes? A panic attack may be caused by:  An extreme,  life-threatening situation, such as a war or natural disaster.  An anxiety disorder, such as post-traumatic stress disorder.  Depression.  Certain medical conditions, including heart problems, neurological conditions, and infections.  Certain over-the-counter and prescription medicines.  Illegal drugs that increase heart rate and blood pressure, such as methamphetamine.  Alcohol.  Supplements that increase anxiety.  Panic disorder. What increases the risk? You are more likely to develop this condition if:  You have an anxiety disorder.  You have another mental health condition.  You take certain medicines.  You use alcohol, illegal drugs, or other substances.  You are under extreme stress.  A life event is causing increased feelings of anxiety and depression. What are the signs or symptoms? A panic attack starts suddenly, usually lasts about 20 minutes, and occurs with one or more of the following:  A pounding heart.  A feeling that your heart is beating irregularly or faster than normal (palpitations).  Sweating.  Trembling or shaking.  Shortness of breath or feeling smothered.  Feeling choked.  Chest pain or discomfort.  Nausea or a strange feeling in your stomach.  Dizziness, feeling lightheaded, or feeling like you might faint.  Chills or hot flashes.  Numbness or tingling in your lips, hands, or feet.  Feeling confused, or feeling that you are not yourself.  Fear of losing control or being emotionally unstable.  Fear of dying. How is this diagnosed? A panic attack is diagnosed with an assessment by your health care provider. During the assessment your health care provider will ask questions about:  Your history of anxiety, depression, and panic attacks.  Your medical history.  Whether you drink alcohol, use illegal drugs, take supplements, or take medicines. Be honest about your substance use. Your health care provider may also:  Order blood  tests or other kinds of tests to rule out serious medical conditions.  Refer you to a mental health professional for further evaluation. How is this treated? Treatment depends on the cause of the panic attack:  If the cause is a medical problem, your health care provider will either treat that problem or refer you to a specialist.  If the cause is emotional, you may be given anti-anxiety medicines or referred to a counselor. These medicines may reduce how often attacks happen, reduce how severe the attacks are, and lower anxiety.  If the cause is a medicine, your health care provider may tell you to stop the medicine, change your dose, or take a different medicine.  If the cause is a drug, treatment may involve letting the drug wear off and taking medicine to help the drug leave your body or to counteract its effects. Attacks caused by drug abuse may continue even if you stop using the drug. Follow these  instructions at home:  Take over-the-counter and prescription medicines only as told by your health care provider.  If you feel anxious, limit your caffeine intake.  Take good care of your physical and mental health by: ? Eating a balanced diet that includes plenty of fresh fruits and vegetables, whole grains, lean meats, and low-fat dairy. ? Getting plenty of rest. Try to get 7-8 hours of uninterrupted sleep each night. ? Exercising regularly. Try to get 30 minutes of physical activity at least 5 days a week. ? Not smoking. Talk to your health care provider if you need help quitting. ? Limiting alcohol intake to no more than 1 drink a day for nonpregnant women and 2 drinks a day for men. One drink equals 12 oz of beer, 5 oz of wine, or 1 oz of hard liquor.  Keep all follow-up visits as told by your health care provider. This is important. Panic attacks may have underlying physical or emotional problems that take time to accurately diagnose. Contact a health care provider if:  Your  symptoms do not improve, or they get worse.  You are not able to take your medicine as prescribed because of side effects. Get help right away if:  You have serious thoughts about hurting yourself or others.  You have symptoms of a panic attack. Do not drive yourself to the hospital. Have someone else drive you or call an ambulance. If you ever feel like you may hurt yourself or others, or you have thoughts about taking your own life, get help right away. You can go to your nearest emergency department or call:  Your local emergency services (911 in the [place]  A suicide crisis helpline, such as the [facility] at 614-485-7936. This is open 24 hours a day. Summary  A panic attack is a sign of a serious health or mental health condition. Get help right away. Do not drive yourself to the hospital. Have someone else drive you or call an ambulance.  Always see a health care provider to have the reasons for the panic attack correctly diagnosed.  If your panic attack was caused by a physical problem, follow your health care provider's suggestions for medicine, referral to a specialist, and lifestyle changes.  If your panic attack was caused by an emotional problem, follow through with counseling from a qualified mental health specialist.  If you feel like you may hurt yourself or others, call 911 and get help right away. This information is not intended to replace advice given to you by your health care provider. Make sure you discuss any questions you have with your health care provider. Document Released: 09/01/2005 Document Revised: 10/10/2016 Document Reviewed: 10/10/2016 Elsevier Interactive Patient Education  2019 [employer].

## 2018-10-11 ENCOUNTER — Ambulatory Visit
Admission: RE | Admit: 2018-10-11 | Discharge: 2018-10-11 | Disposition: A | Discharge status: Home / Self Care | Payer: 59 | Source: Ambulatory Visit | Attending: Family Medicine | Admitting: Family Medicine

## 2018-10-11 DIAGNOSIS — R1011 Right upper quadrant pain: Secondary | ICD-10-CM | POA: Diagnosis not present

## 2019-01-13 ENCOUNTER — Encounter

## 2019-01-13 ENCOUNTER — Encounter: Payer: 59 | Admitting: Family Medicine

## 2019-02-01 IMAGING — US US ABDOMEN LIMITED
1 series · 14 of 25 positions shown · non-contrast
Comparison: 09/29/2012

CLINICAL DATA: Right upper quadrant pain

EXAM:
ULTRASOUND ABDOMEN LIMITED RIGHT UPPER QUADRANT

[Series 1: us abdomen limited · 0.19mm/px · 14 of 34 slices shown]
[im 1/34]
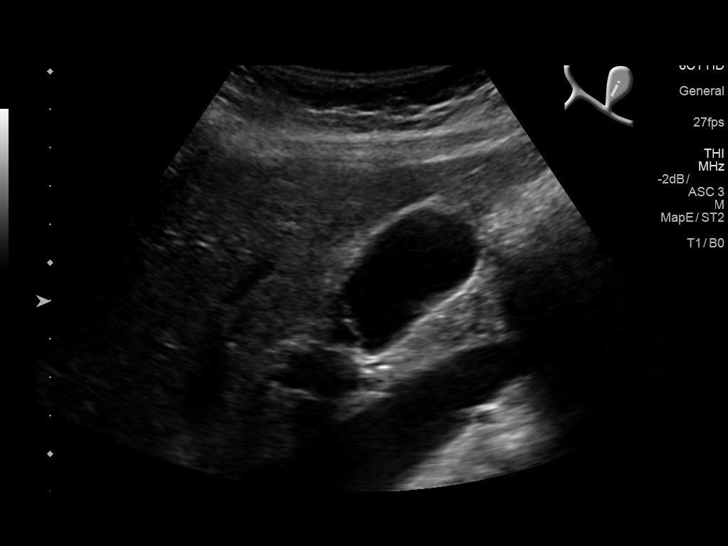
[im 3/34]
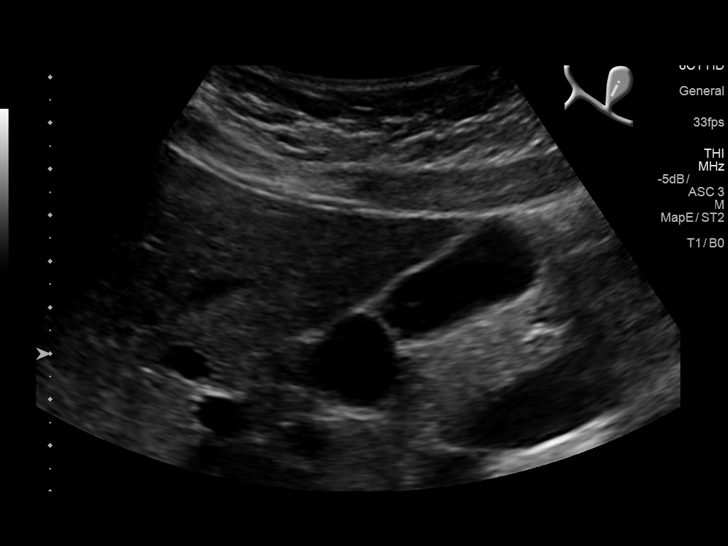
[im 6/34]
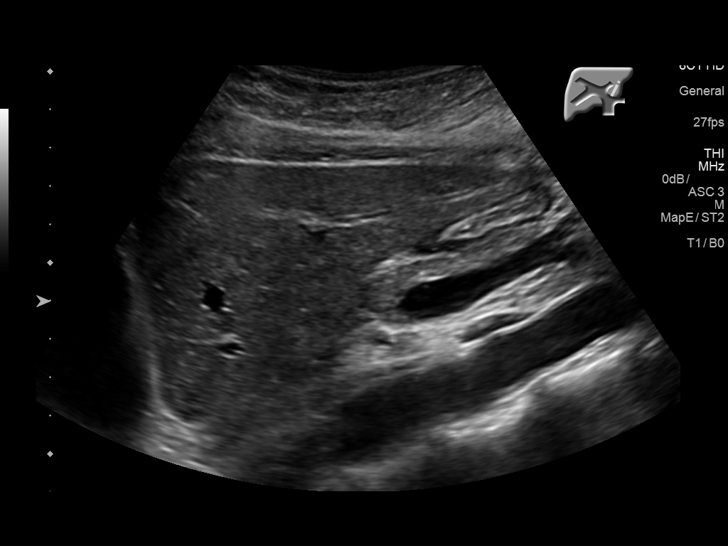
[im 9/34]
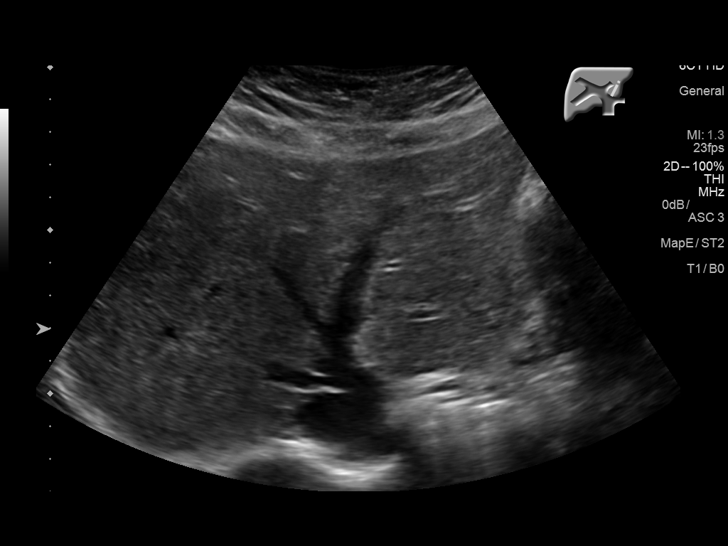
[im 12/34]
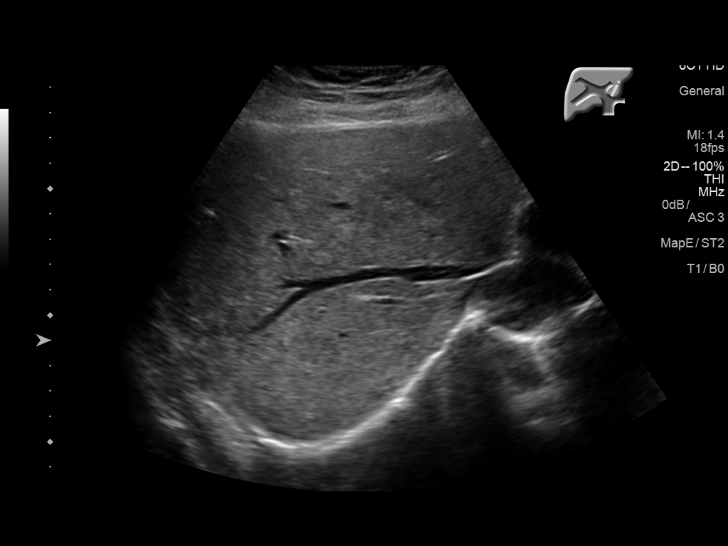
[im 13/34]
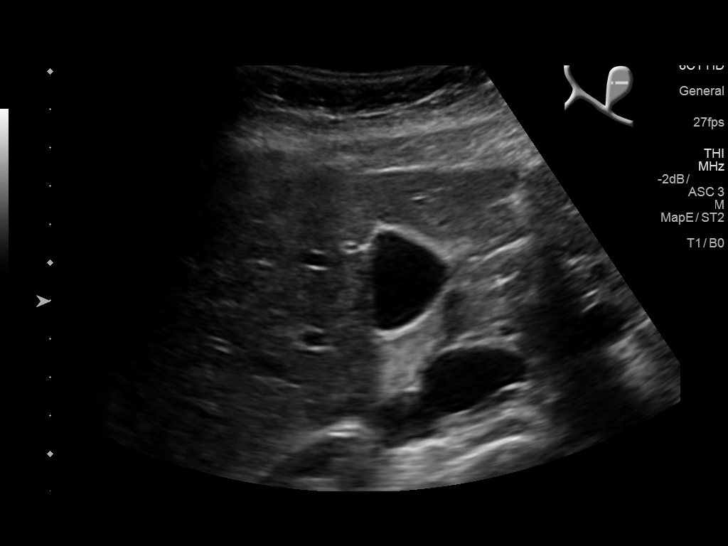
[im 16/34]
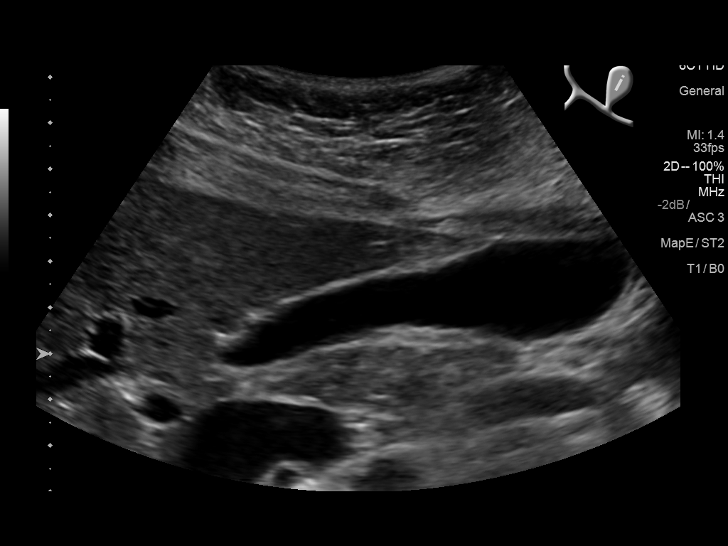
[im 18/34]
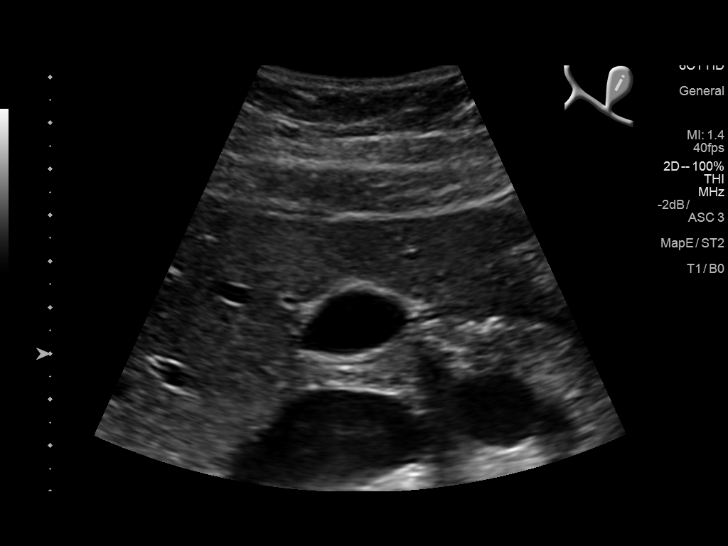
[im 21/34]
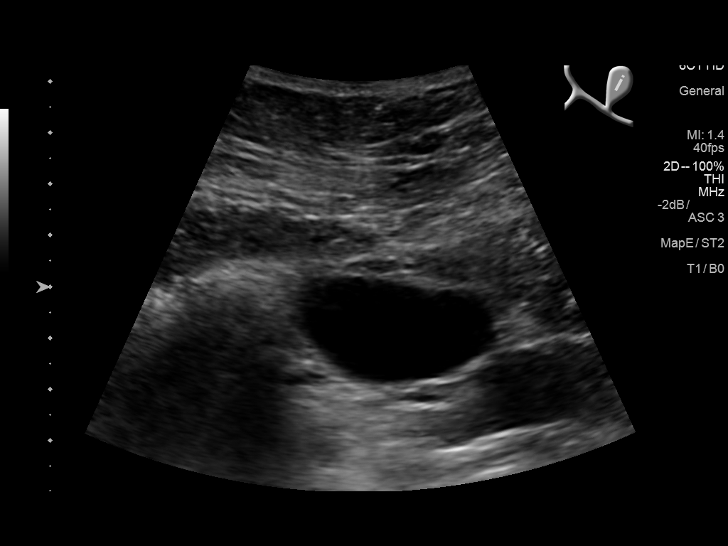
[im 23/34]
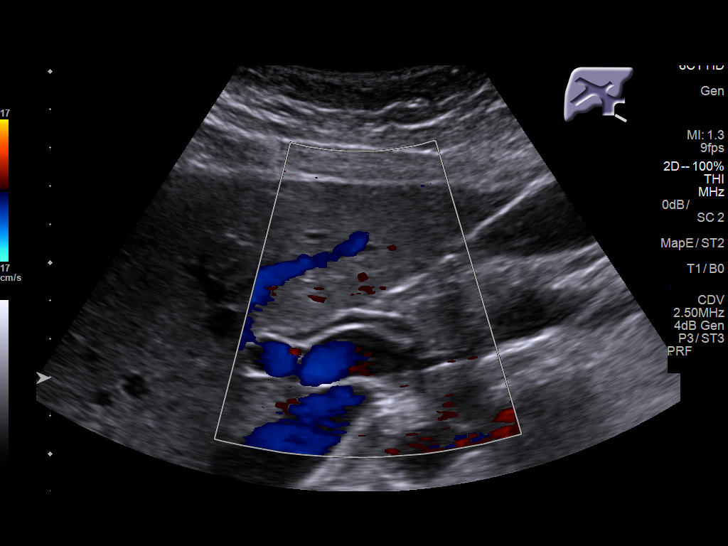
[im 25/34]
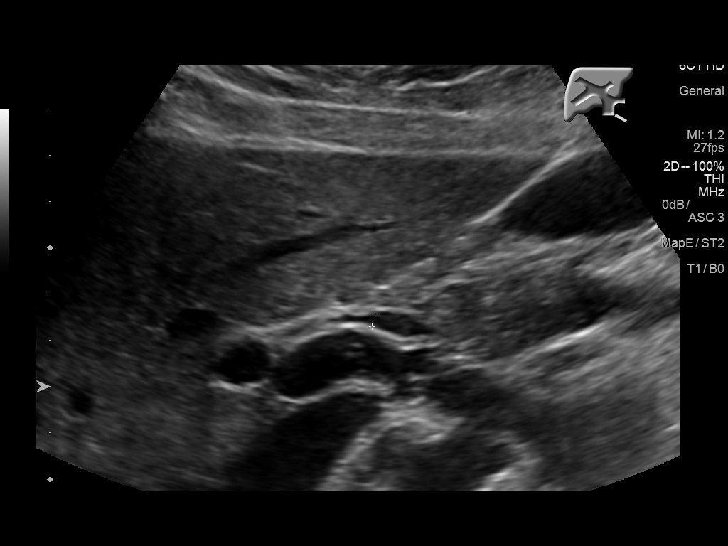
[im 28/34]
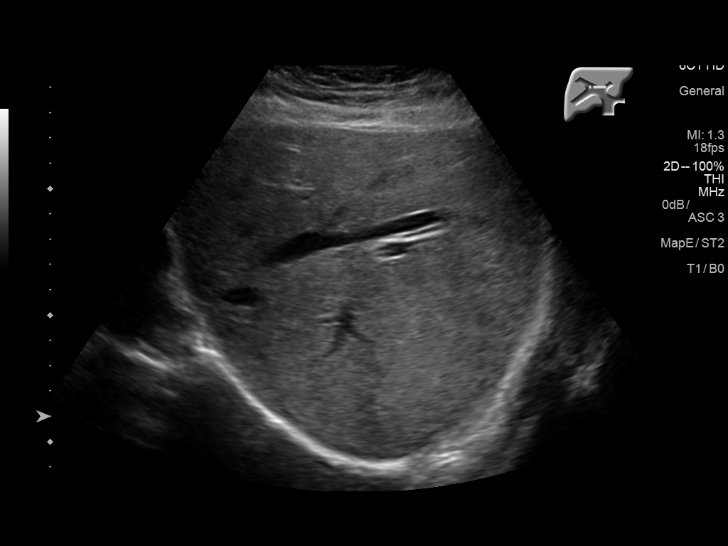
[im 31/34]
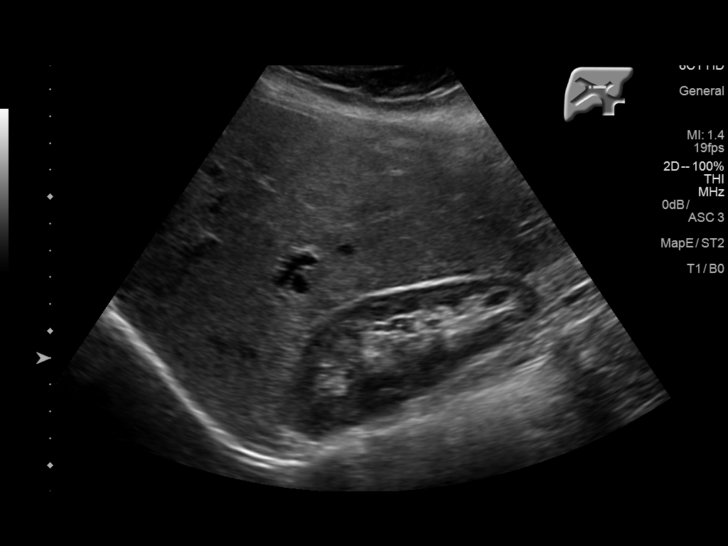
[im 34/34]
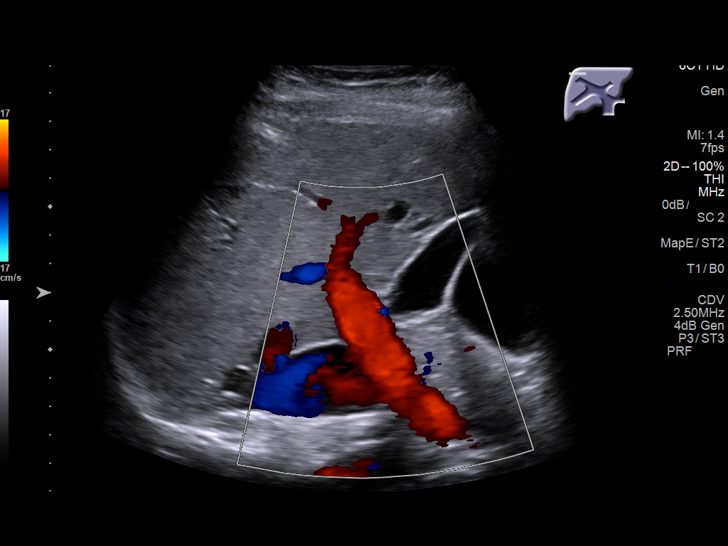

[14 of 25 positions shown; findings below may reference images not displayed]

FINDINGS: Gallbladder:

No gallstones or wall thickening visualized. No sonographic Murphy
sign noted by sonographer.

Common bile duct:

Diameter: 2.8 mm.

Liver:

No focal lesion identified. Within normal limits in parenchymal
echogenicity. Portal vein is patent on color Doppler imaging with
normal direction of blood flow towards the liver.
IMPRESSION: No acute abnormality noted.

## 2020-09-13 ENCOUNTER — Other Ambulatory Visit: Payer: 59

## 2020-09-17 ENCOUNTER — Other Ambulatory Visit: Payer: 59

## 2020-10-17 ENCOUNTER — Other Ambulatory Visit: Payer: Self-pay | Admitting: Obstetrics and Gynecology

## 2020-10-18 ENCOUNTER — Other Ambulatory Visit: Payer: Self-pay | Admitting: Obstetrics and Gynecology

## 2020-10-18 DIAGNOSIS — R928 Other abnormal and inconclusive findings on diagnostic imaging of breast: Secondary | ICD-10-CM

## 2020-11-02 ENCOUNTER — Ambulatory Visit
Admission: RE | Admit: 2020-11-02 | Discharge: 2020-11-02 | Disposition: A | Discharge status: Home / Self Care | Payer: 59 | Source: Ambulatory Visit | Attending: Obstetrics and Gynecology | Admitting: Obstetrics and Gynecology

## 2020-11-02 ENCOUNTER — Other Ambulatory Visit: Payer: Self-pay

## 2020-11-02 DIAGNOSIS — R928 Other abnormal and inconclusive findings on diagnostic imaging of breast: Secondary | ICD-10-CM

## 2021-03-13 ENCOUNTER — Encounter: Payer: Self-pay | Admitting: *Deleted

## 2021-09-01 IMAGING — US US BREAST*R* LIMITED INC AXILLA
1 series · 13 of 13 positions shown · non-contrast
Comparison: 10/15/2020 and earlier

CLINICAL DATA: Patient presents after screening study for
evaluation of possible masses in both breasts.

EXAM:
ULTRASOUND OF THE BILATERAL BREAST

[Series 1: us breast*right* limited inc axilla · 0.07mm/px · 13 of 13 slices shown]
[im 1/13]
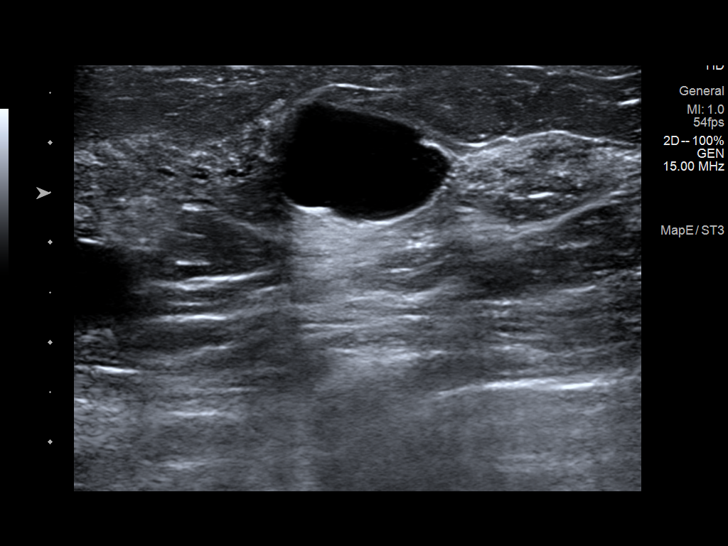
[im 2/13]
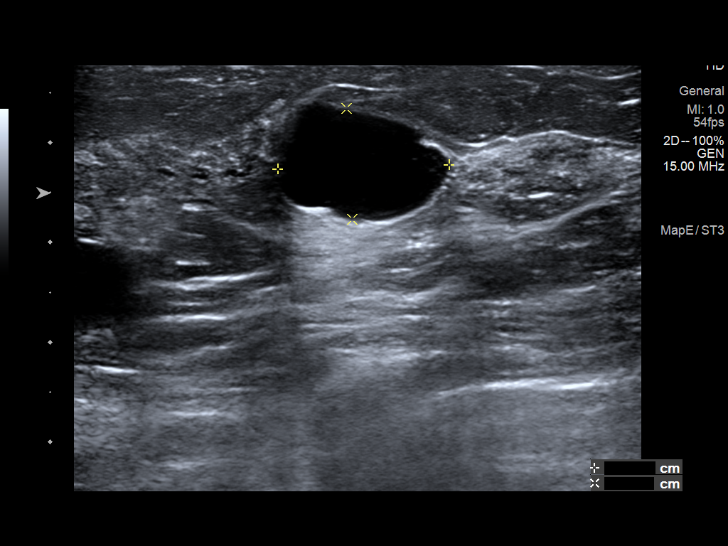
[im 3/13]
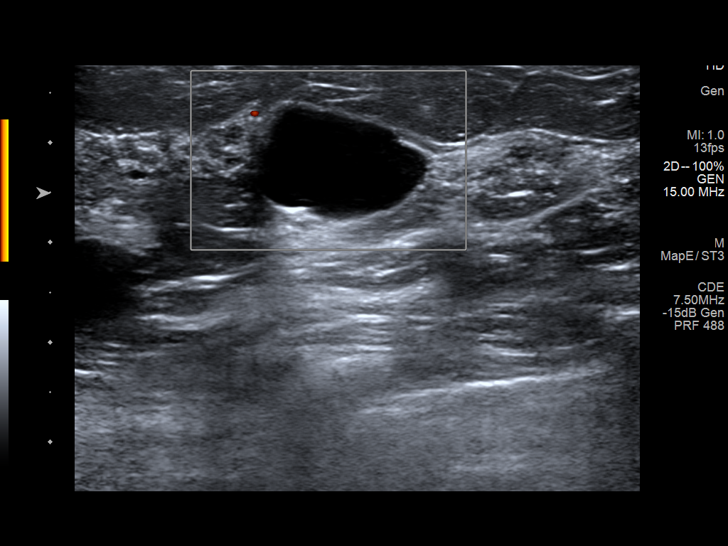
[im 4/13]
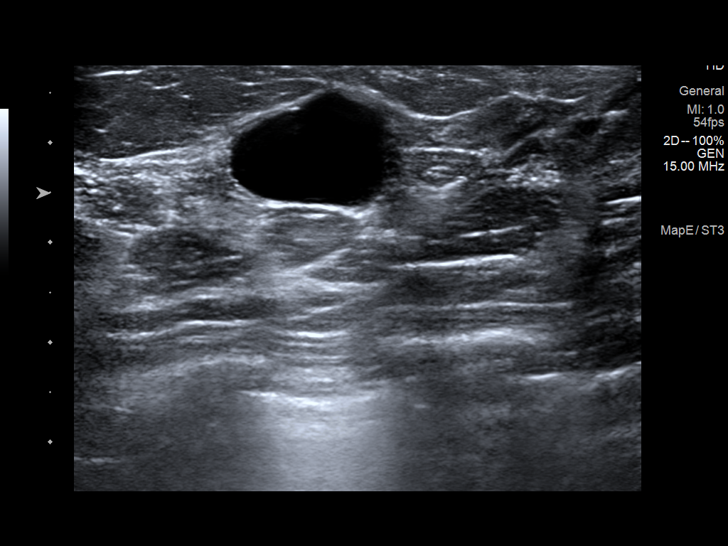
[im 5/13]
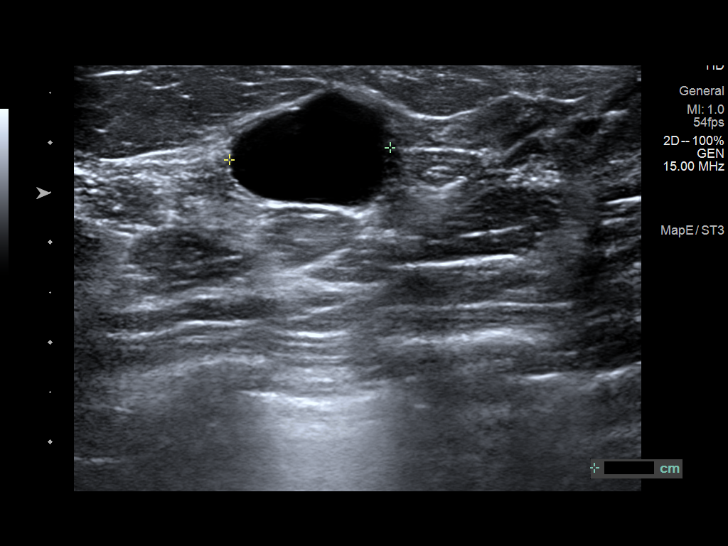
[im 6/13]
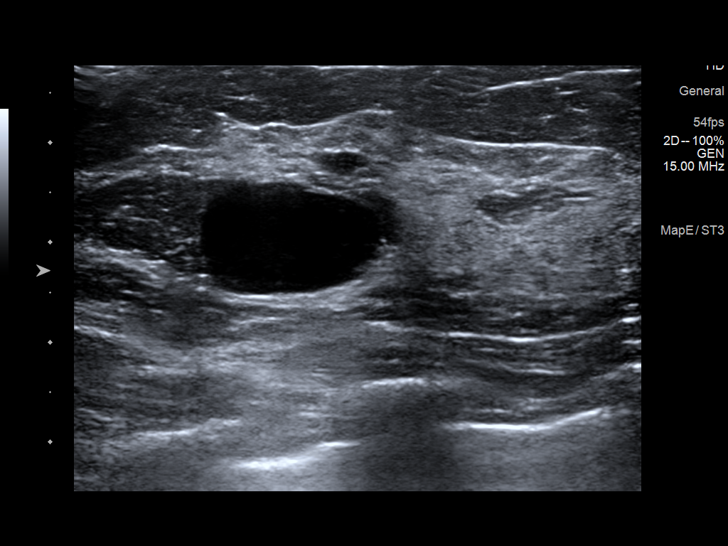
[im 7/13]
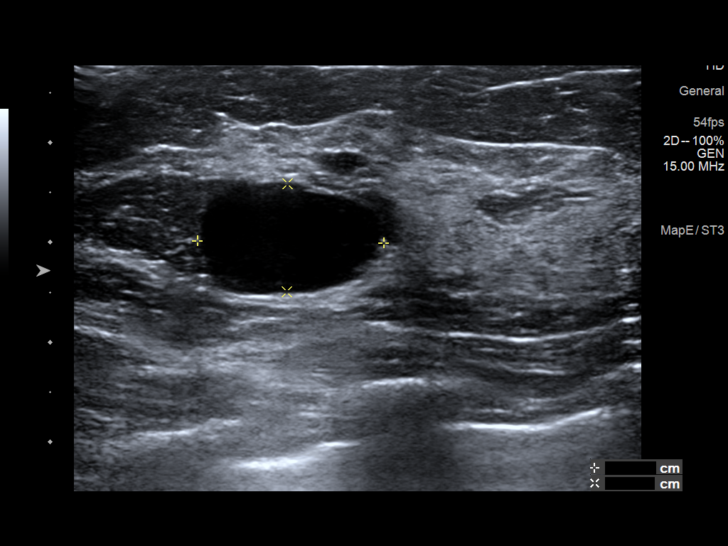
[im 8/13]
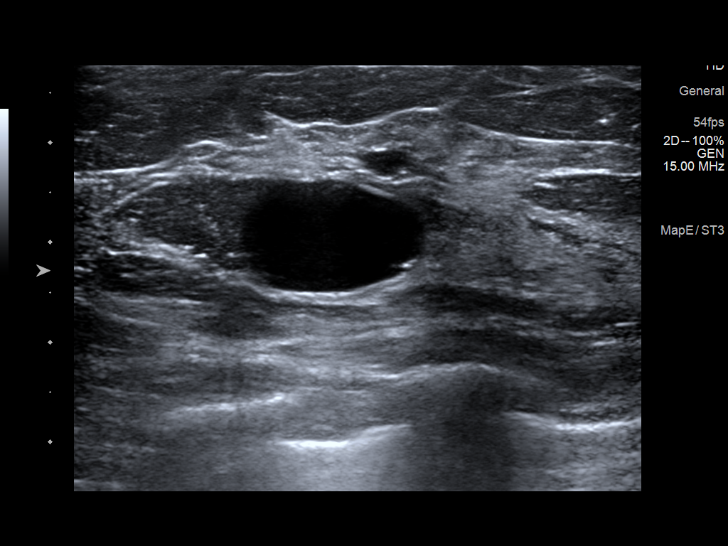
[im 9/13]
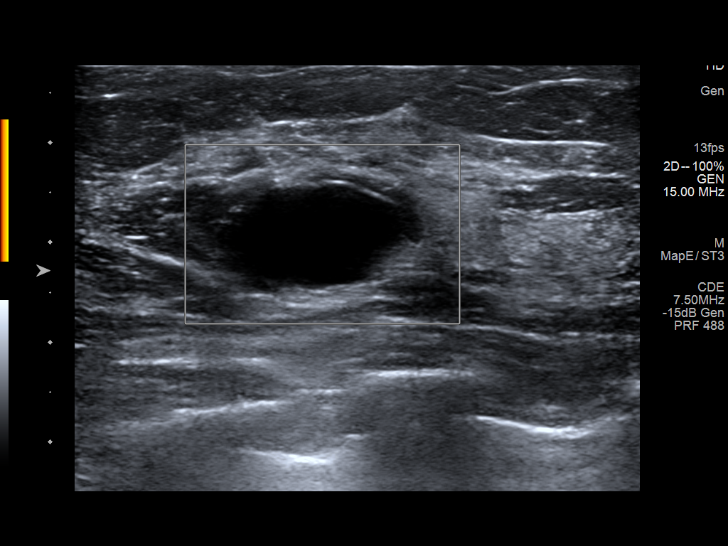
[im 10/13]
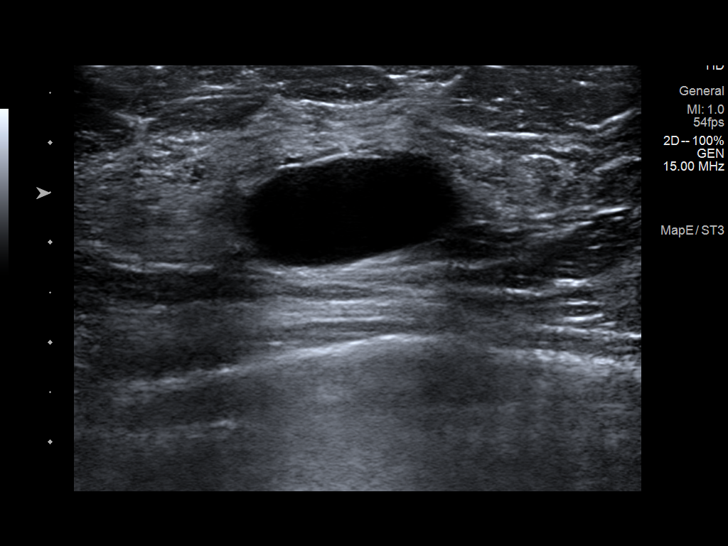
[im 11/13]
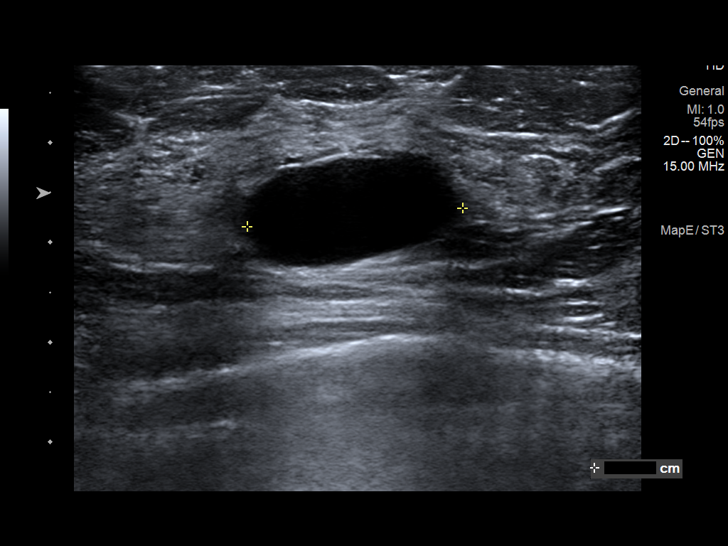
[im 12/13]
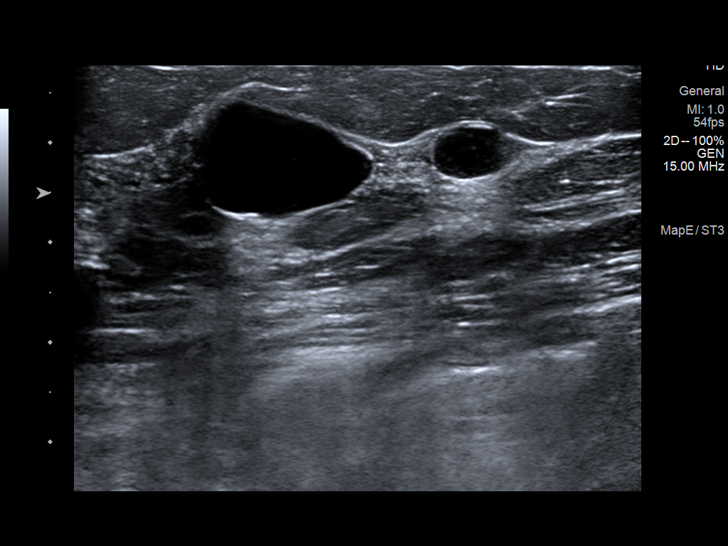
[im 13/13]
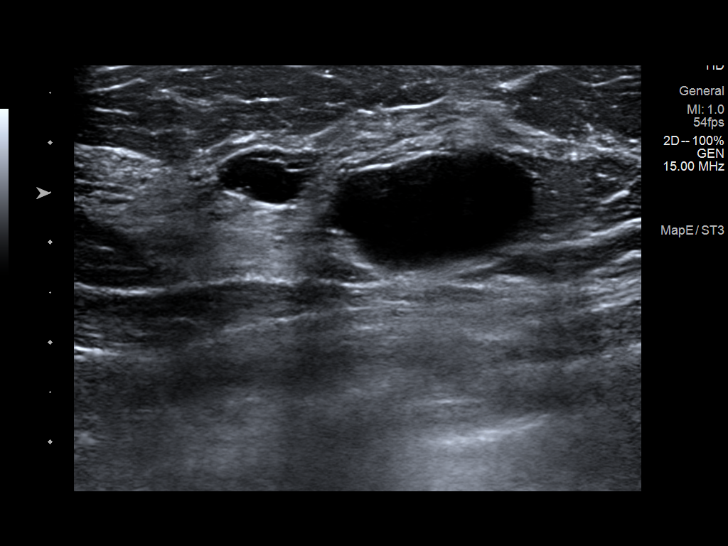

[13 of 13 positions shown; findings below may reference images not displayed]

FINDINGS: RIGHT breast: Targeted ultrasound is performed, showing a simple
cyst in the 12 o'clock location of the RIGHT breast 1 centimeter
from the nipple measuring 1.7 x 1.1 x 1.6 centimeters.

In the 10 o'clock location 3 centimeters from the nipple, a benign
simple cyst is 1.9 x 1.1 by 2.2 centimeters.

LEFT breast: Targeted ultrasound is performed, showing a simple cyst
in the 12 o'clock location 3 centimeters from the nipple which
measures 0.8 x 0.5 x 0.7 centimeters. Additional sub centimeter
cysts are identified in the same portion of the breast.

In the 3 o'clock location 3 centimeters from the nipple, simple cyst
is 0.7 x 0.4 x 0.8 centimeters.
IMPRESSION: Bilateral benign cysts. There is no sonographic evidence for
malignancy.

RECOMMENDATION:
Screening mammogram in one year.(Code:[id number]-[id number])

I have discussed the findings and recommendations with the patient.
If applicable, a reminder letter will be sent to the patient
regarding the next appointment.

BI-RADS CATEGORY  2: Benign.

## 2021-09-01 IMAGING — US US BREAST*L* LIMITED INC AXILLA
1 series · 12 of 12 positions shown · non-contrast
Comparison: 10/15/2020 and earlier

CLINICAL DATA: Patient presents after screening study for
evaluation of possible masses in both breasts.

EXAM:
ULTRASOUND OF THE BILATERAL BREAST

[Series 1: us breast*left* limited inc axilla · 0.07mm/px · 12 of 12 slices shown]
[im 1/12]
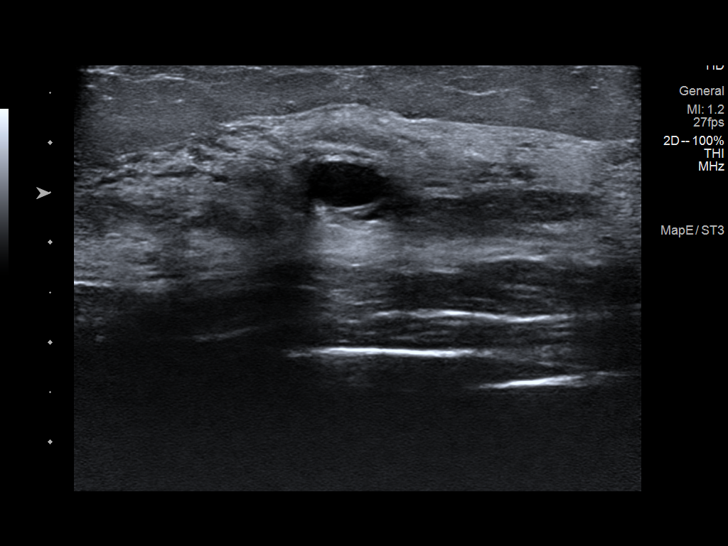
[im 2/12]
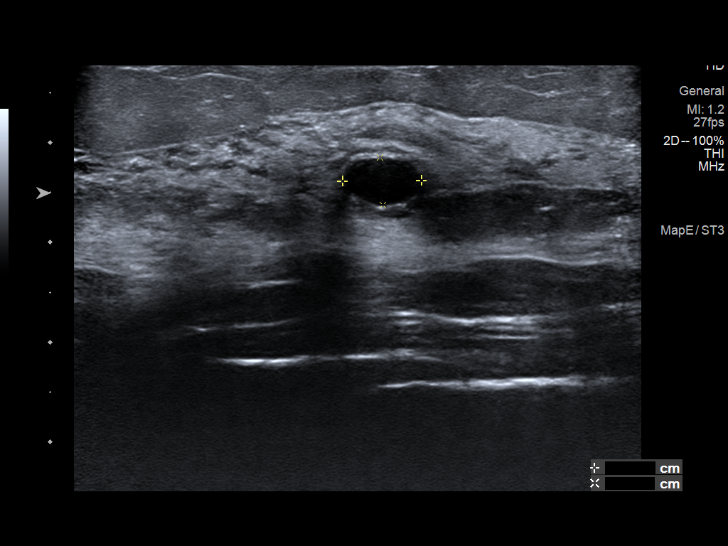
[im 3/12]
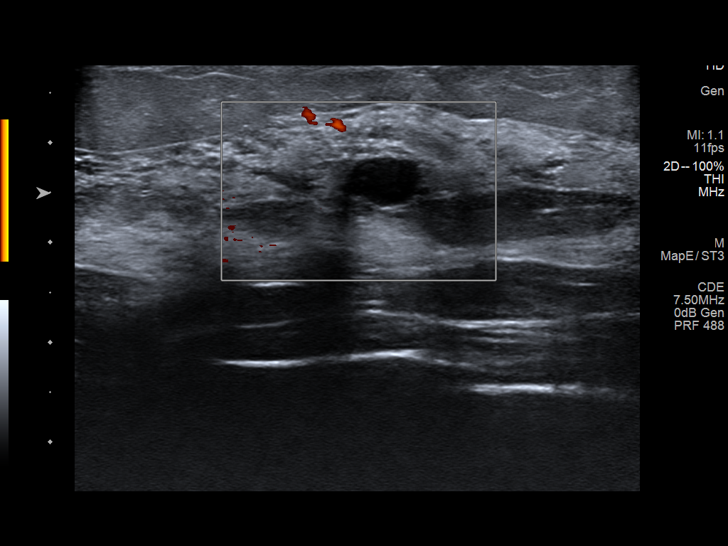
[im 4/12]
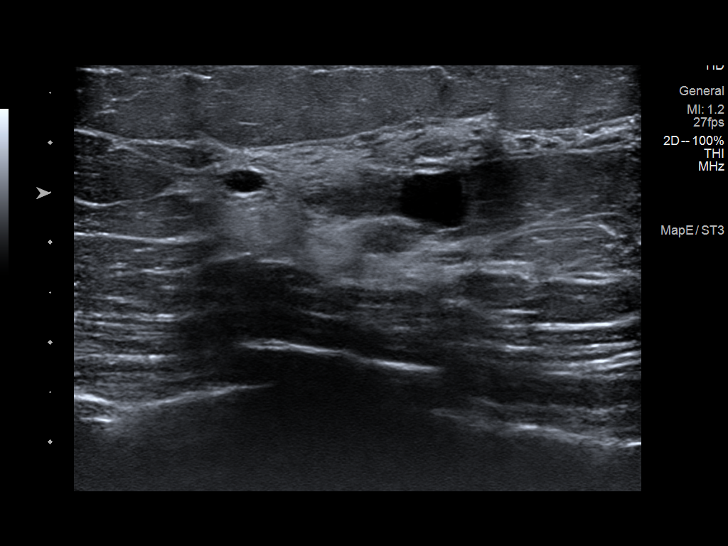
[im 5/12]
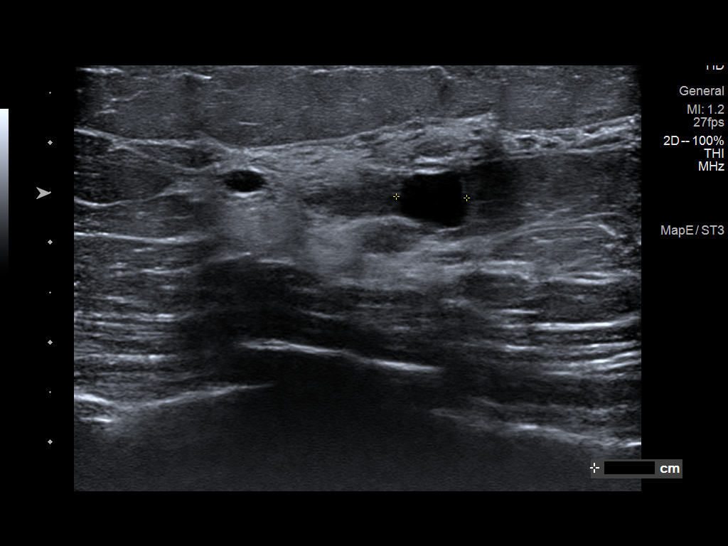
[im 6/12]
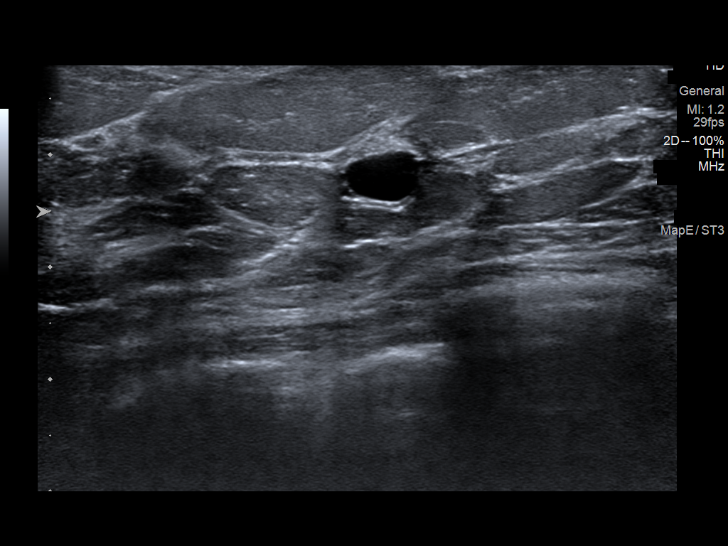
[im 7/12]
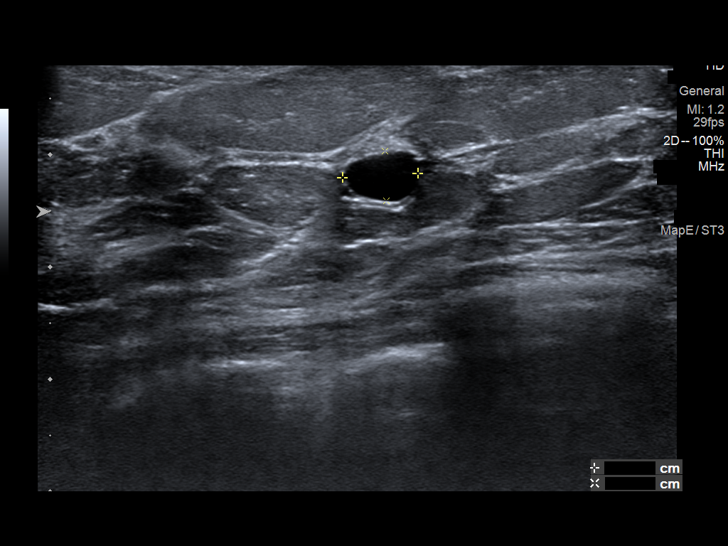
[im 8/12]
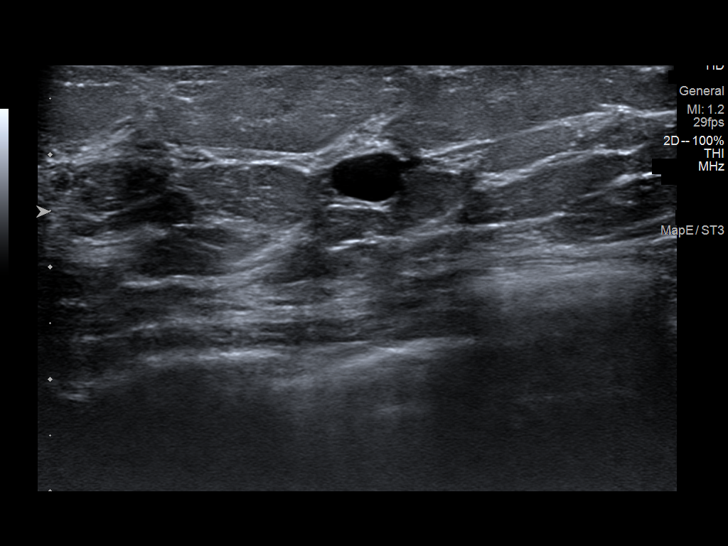
[im 9/12]
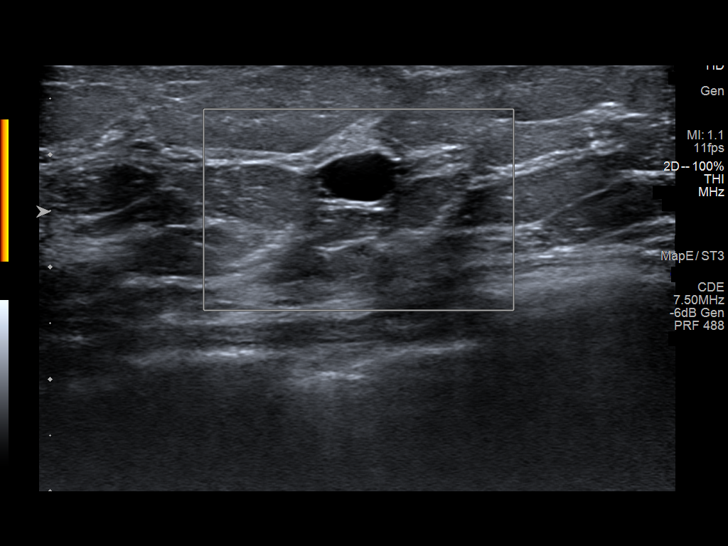
[im 10/12]
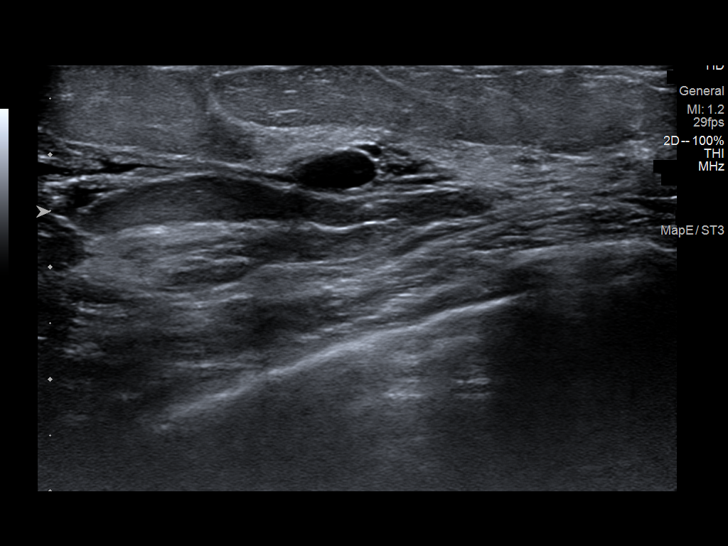
[im 11/12]
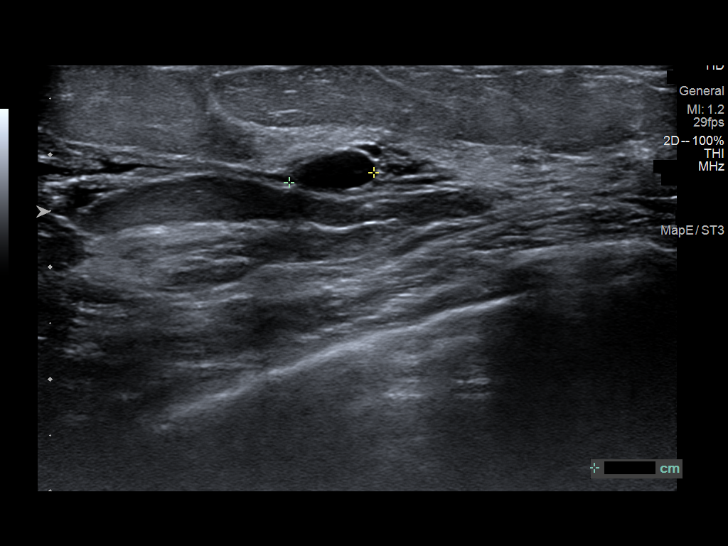
[im 12/12]
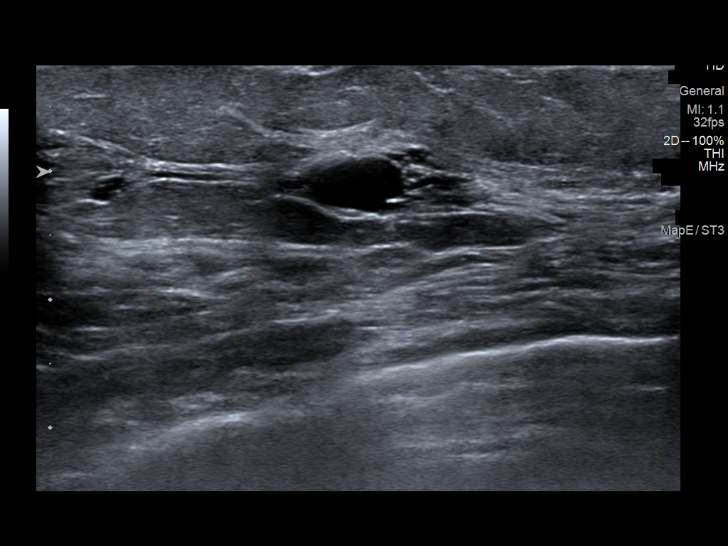

[12 of 12 positions shown; findings below may reference images not displayed]

FINDINGS: RIGHT breast: Targeted ultrasound is performed, showing a simple
cyst in the 12 o'clock location of the RIGHT breast 1 centimeter
from the nipple measuring 1.7 x 1.1 x 1.6 centimeters.

In the 10 o'clock location 3 centimeters from the nipple, a benign
simple cyst is 1.9 x 1.1 by 2.2 centimeters.

LEFT breast: Targeted ultrasound is performed, showing a simple cyst
in the 12 o'clock location 3 centimeters from the nipple which
measures 0.8 x 0.5 x 0.7 centimeters. Additional sub centimeter
cysts are identified in the same portion of the breast.

In the 3 o'clock location 3 centimeters from the nipple, simple cyst
is 0.7 x 0.4 x 0.8 centimeters.
IMPRESSION: Bilateral benign cysts. There is no sonographic evidence for
malignancy.

RECOMMENDATION:
Screening mammogram in one year.(Code:[id number]-[id number])

I have discussed the findings and recommendations with the patient.
If applicable, a reminder letter will be sent to the patient
regarding the next appointment.

BI-RADS CATEGORY  2: Benign.

## 2021-12-19 LAB — HM MAMMOGRAPHY

## 2021-12-19 LAB — RESULTS CONSOLE HPV: CHL HPV: NEGATIVE

## 2021-12-19 LAB — HM PAP SMEAR

## 2022-09-17 ENCOUNTER — Encounter: Payer: Self-pay | Admitting: Family Medicine

## 2022-09-17 DIAGNOSIS — R87612 Low grade squamous intraepithelial lesion on cytologic smear of cervix (LGSIL): Secondary | ICD-10-CM | POA: Insufficient documentation

## 2022-09-17 DIAGNOSIS — K5900 Constipation, unspecified: Secondary | ICD-10-CM | POA: Insufficient documentation

## 2022-09-17 DIAGNOSIS — R141 Gas pain: Secondary | ICD-10-CM | POA: Insufficient documentation

## 2022-09-23 ENCOUNTER — Ambulatory Visit (INDEPENDENT_AMBULATORY_CARE_PROVIDER_SITE_OTHER): Payer: 59 | Admitting: Family Medicine

## 2022-09-23 ENCOUNTER — Encounter: Payer: Self-pay | Admitting: Family Medicine

## 2022-09-23 VITALS — BP 99/65 | HR 62 | Temp 98.6°F | Ht 63.0 in | Wt 150.0 lb

## 2022-09-23 DIAGNOSIS — Z130 Encounter for screening for diseases of the blood and blood-forming organs and certain disorders involving the immune mechanism: Secondary | ICD-10-CM

## 2022-09-23 DIAGNOSIS — Z8249 Family history of ischemic heart disease and other diseases of the circulatory system: Secondary | ICD-10-CM | POA: Diagnosis not present

## 2022-09-23 DIAGNOSIS — Z0000 Encounter for general adult medical examination without abnormal findings: Secondary | ICD-10-CM

## 2022-09-23 DIAGNOSIS — Z131 Encounter for screening for diabetes mellitus: Secondary | ICD-10-CM

## 2022-09-23 DIAGNOSIS — Z1159 Encounter for screening for other viral diseases: Secondary | ICD-10-CM | POA: Diagnosis not present

## 2022-09-23 DIAGNOSIS — Z13220 Encounter for screening for lipoid disorders: Secondary | ICD-10-CM | POA: Diagnosis not present

## 2022-09-23 DIAGNOSIS — Z7689 Persons encountering health services in other specified circumstances: Secondary | ICD-10-CM | POA: Diagnosis not present

## 2022-09-23 LAB — CBC
HCT: 41.7 % (ref 36.0–46.0)
Hemoglobin: 14.2 g/dL (ref 12.0–15.0)
MCHC: 34.1 g/dL (ref 30.0–36.0)
MCV: 92.4 fl (ref 78.0–100.0)
Platelets: 230 10*3/uL (ref 150.0–400.0)
RBC: 4.51 Mil/uL (ref 3.87–5.11)
RDW: 13.1 % (ref 11.5–15.5)
WBC: 4.2 10*3/uL (ref 4.0–10.5)

## 2022-09-23 LAB — COMPREHENSIVE METABOLIC PANEL
ALT: 23 U/L (ref 0–35)
AST: 24 U/L (ref 0–37)
Albumin: 4.4 g/dL (ref 3.5–5.2)
Alkaline Phosphatase: 43 U/L (ref 39–117)
BUN: 11 mg/dL (ref 6–23)
CO2: 27 mEq/L (ref 19–32)
Calcium: 9.4 mg/dL (ref 8.4–10.5)
Chloride: 104 mEq/L (ref 96–112)
Creatinine, Ser: 0.86 mg/dL (ref 0.40–1.20)
GFR: 82.91 mL/min (ref >60.00)
Glucose, Bld: 86 mg/dL (ref 70–99)
Potassium: 4.4 mEq/L (ref 3.5–5.1)
Sodium: 141 mEq/L (ref 135–145)
Total Bilirubin: 0.8 mg/dL (ref 0.2–1.2)
Total Protein: 6.6 g/dL (ref 6.0–8.3)

## 2022-09-23 LAB — LIPID PANEL
Cholesterol: 141 mg/dL (ref 0–200)
HDL: 72.5 mg/dL (ref >39.00)
LDL Cholesterol: 53 mg/dL (ref 0–99)
NonHDL: 68.13
Total CHOL/HDL Ratio: 2
Triglycerides: 74 mg/dL (ref 0.0–149.0)
VLDL: 14.8 mg/dL (ref 0.0–40.0)

## 2022-09-23 LAB — HEMOGLOBIN A1C: Hgb A1c MFr Bld: 5.3 % (ref 4.6–6.5)

## 2022-09-23 NOTE — Progress Notes (Signed)
Patient ID: Angela Cooley, female  DOB: [birth year], 42 y.o.   MRN: [id number] Patient Care Team    Relationship Specialty Notifications Start End  Natalia Leatherwood, DO PCP - General Family Medicine  09/23/22   Davina Poke  Optometry  09/23/22   Marcelle Overlie, MD Consulting Physician Obstetrics and Gynecology  09/23/22     Chief Complaint  Patient presents with   Establish Care   Annual Exam    Pt is not fasting     Subjective:  Angela Cooley is a 42 y.o.  female present for new patient establishment. All past medical history, surgical history, allergies, family history, immunizations, medications and social history were updated in the electronic medical record today. All recent labs, ED visits and hospitalizations within the last year were reviewed.  Health maintenance:  Colonoscopy: no fhx. Routine screen at 43 Mammogram: completed:2023- Dr. Adalberto Ill- records requested Cervical cancer screening: last pap: 2023- Dr. Adalberto Ill- records requested Immunizations: tdap declined today, Influenza declined (encouraged yearly) Infectious disease screening: HIV completed with pregnancy, Hep C screening today. DEXA: routine screen Patient has a Dental home. Hospitalizations/ED visits:reviewed     09/23/2022   10:25 AM 04/02/2018    8:30 AM 12/30/2017    3:15 PM  Depression screen PHQ 2/9  Decreased Interest 0 0 0  Down, Depressed, Hopeless 0 0 0  PHQ - 2 Score 0 0 0  Altered sleeping  0 0  Tired, decreased energy  0 0  Change in appetite  0 0  Feeling bad or failure about yourself   0 0  Trouble concentrating  0 0  Moving slowly or fidgety/restless  0 0  Suicidal thoughts  0 0  PHQ-9 Score  0 0  Difficult doing work/chores  Not difficult at all Not difficult at all       No data to display                   12/30/2017    3:16 PM  Fall Risk   Falls in the past year? No     Immunization History  Administered Date(s) Administered   Unspecified SARS-COV-2  Vaccination 05/01/2020    No results found.  Past Medical History:  Diagnosis Date   Asthma    Chicken pox    GERD (gastroesophageal reflux disease)    No Known Allergies Past Surgical History:  Procedure Laterality Date   CESAREAN SECTION  2011   x2 (2007 and 2011)   Family History  Problem Relation Age of Onset   Hypertension Mother    Arthritis Mother    Breast cancer Mother    Heart disease Maternal Grandmother    Arthritis Maternal Grandmother    Arthritis Maternal Grandfather    Melanoma Maternal Grandfather    Stroke Paternal Grandmother    Social History   Social History Narrative   Marital status/children/pets: Married, 2 children.   Education/employment: Haematologist.  Works as a Architect.   Safety:      -smoke alarm in the home:Yes     - wears seatbelt: Yes     - Feels safe in their relationships: Yes       Allergies as of 09/23/2022   No Known Allergies      Medication List        Accurate as of September 23, 2022 10:53 AM. If you have any questions, ask your nurse or doctor.  ALPRAZolam 0.5 MG tablet Commonly known as: Xanax Take 1 tablet (0.5 mg total) by mouth daily as needed for anxiety (or panic symptoms).   Intrarosa 6.5 MG Inst Generic drug: Prasterone INSERT 1 VAGINAL INSERT BY VAGINAL ROUTE EVERY DAY   pantoprazole 40 MG tablet Commonly known as: PROTONIX Take 1 tablet (40 mg total) by mouth daily.        All past medical history, surgical history, allergies, family history, immunizations andmedications were updated in the EMR today and reviewed under the history and medication portions of their EMR.    No results found for this or any previous visit (from the past 2160 hour(s)).  US BREAST LTD UNI LEFT INC AXILLA Result Date: 11/02/2020 CLINICAL DATA:  Patient presents after screening study for evaluation of possible masses in both breasts. EXAM: ULTRASOUND OF THE BILATERAL BREAST COMPARISON:   10/15/2020 and earlier FINDINGS: RIGHT breast: Targeted ultrasound is performed, showing a simple cyst in the 12 o'clock location of the RIGHT breast 1 centimeter from the nipple measuring 1.7 x 1.1 x 1.6 centimeters. In the 10 o'clock location 3 centimeters from the nipple, a benign simple cyst is 1.9 x 1.1 by 2.2 centimeters. LEFT breast: Targeted ultrasound is performed, showing a simple cyst in the 12 o'clock location 3 centimeters from the nipple which measures 0.8 x 0.5 x 0.7 centimeters. Additional sub centimeter cysts are identified in the same portion of the breast. In the 3 o'clock location 3 centimeters from the nipple, simple cyst is 0.7 x 0.4 x 0.8 centimeters. IMPRESSION: Bilateral benign cysts. There is no sonographic evidence for malignancy. RECOMMENDATION: Screening mammogram in one year.(Code:SM-B-01Y) I have discussed the findings and recommendations with the patient. If applicable, a reminder letter will be sent to the patient regarding the next appointment. BI-RADS CATEGORY  2: Benign. Electronically Signed   By: Norva Pavlov M.D.   On: 11/02/2020 11:26   US BREAST LTD UNI RIGHT INC AXILLA  Result Date: 11/02/2020 CLINICAL DATA:  Patient presents after screening study for evaluation of possible masses in both breasts. EXAM: ULTRASOUND OF THE BILATERAL BREAST COMPARISON:  10/15/2020 and earlier FINDINGS: RIGHT breast: Targeted ultrasound is performed, showing a simple cyst in the 12 o'clock location of the RIGHT breast 1 centimeter from the nipple measuring 1.7 x 1.1 x 1.6 centimeters. In the 10 o'clock location 3 centimeters from the nipple, a benign simple cyst is 1.9 x 1.1 by 2.2 centimeters. LEFT breast: Targeted ultrasound is performed, showing a simple cyst in the 12 o'clock location 3 centimeters from the nipple which measures 0.8 x 0.5 x 0.7 centimeters. Additional sub centimeter cysts are identified in the same portion of the breast. In the 3 o'clock location 3 centimeters from  the nipple, simple cyst is 0.7 x 0.4 x 0.8 centimeters. IMPRESSION: Bilateral benign cysts. There is no sonographic evidence for malignancy. RECOMMENDATION: Screening mammogram in one year.(Code:SM-B-01Y) I have discussed the findings and recommendations with the patient. If applicable, a reminder letter will be sent to the patient regarding the next appointment. BI-RADS CATEGORY  2: Benign. Electronically Signed   By: Norva Pavlov M.D.   On: 11/02/2020 11:26    ROS 14 pt review of systems performed and negative (unless mentioned in an HPI)  Objective: BP 99/65   Pulse 62   Temp 98.6 F (37 C) (Oral)   Ht 5\' 3"  (1.6 m)   Wt 150 lb (68 kg)   SpO2 99%   BMI 26.57 kg/m  Physical Exam  Vitals and nursing note reviewed.  Constitutional:      General: She is not in acute distress.    Appearance: Normal appearance. She is not ill-appearing or toxic-appearing.  HENT:     Head: Normocephalic and atraumatic.     Right Ear: Tympanic membrane, ear canal and external ear normal. There is no impacted cerumen.     Left Ear: Tympanic membrane, ear canal and external ear normal. There is no impacted cerumen.     Nose: No congestion or rhinorrhea.     Mouth/Throat:     Mouth: Mucous membranes are moist.     Pharynx: Oropharynx is clear. No oropharyngeal exudate or posterior oropharyngeal erythema.  Eyes:     General: No scleral icterus.       Right eye: No discharge.        Left eye: No discharge.     Extraocular Movements: Extraocular movements intact.     Conjunctiva/sclera: Conjunctivae normal.     Pupils: Pupils are equal, round, and reactive to light.  Cardiovascular:     Rate and Rhythm: Normal rate and regular rhythm.     Pulses: Normal pulses.     Heart sounds: Normal heart sounds. No murmur heard.    No friction rub. No gallop.  Pulmonary:     Effort: Pulmonary effort is normal. No respiratory distress.     Breath sounds: Normal breath sounds. No stridor. No wheezing, rhonchi or  rales.  Chest:     Chest wall: No tenderness.  Abdominal:     General: Abdomen is flat. Bowel sounds are normal. There is no distension.     Palpations: Abdomen is soft. There is no mass.     Tenderness: There is no abdominal tenderness. There is no right CVA tenderness, left CVA tenderness, guarding or rebound.     Hernia: No hernia is present.  Musculoskeletal:        General: No swelling, tenderness or deformity. Normal range of motion.     Cervical back: Normal range of motion and neck supple. No rigidity or tenderness.     Right lower leg: No edema.     Left lower leg: No edema.  Lymphadenopathy:     Cervical: No cervical adenopathy.  Skin:    General: Skin is warm and dry.     Coloration: Skin is not jaundiced or pale.     Findings: No bruising, erythema, lesion or rash.  Neurological:     General: No focal deficit present.     Mental Status: She is alert and oriented to person, place, and time. Mental status is at baseline.     Cranial Nerves: No cranial nerve deficit.     Sensory: No sensory deficit.     Motor: No weakness.     Coordination: Coordination normal.     Gait: Gait normal.     Deep Tendon Reflexes: Reflexes normal.  Psychiatric:        Mood and Affect: Mood normal.        Behavior: Behavior normal.        Thought Content: Thought content normal.        Judgment: Judgment normal.     Assessment/plan: MARIDEL PIXLER is a 42 y.o. female present for  Establishing care with new doctor, encounter for/CPE Encounter for hepatitis C screening test for low risk patient - Hepatitis C Antibody Lipid screening/Family history of heart disease - Lipid panel Diabetes mellitus screening - Comp Met (CMET) - Hemoglobin A1c Screening  for deficiency anemia - CBC Routine general medical examination at a health care facility Colonoscopy: no fhx. Routine screen at 43 Mammogram: completed:2023- Dr. Adalberto Ill- records requested Cervical cancer screening: last pap: 2023- Dr.  Adalberto Ill- records requested Immunizations: tdap declined today, Influenza declined (encouraged yearly) Infectious disease screening: HIV completed with pregnancy, Hep C screening today. DEXA: routine screen - CBC - Comp Met (CMET) - Lipid panel - Hemoglobin A1c Patient was encouraged to exercise greater than 150 minutes a week. Patient was encouraged to choose a diet filled with fresh fruits and vegetables, and lean meats. AVS provided to patient today for education/recommendation on gender specific health and safety maintenance. Return in about 1 year (around 09/25/2023) for cpe (20 min).  Orders Placed This Encounter  Procedures   CBC   Comp Met (CMET)   Lipid panel   Hemoglobin A1c   Hepatitis C Antibody   No orders of the defined types were placed in this encounter.  Referral Orders  No referral(s) requested today     Note is dictated utilizing voice recognition software. Although note has been proof read prior to signing, occasional typographical errors still can be missed. If any questions arise, please do not hesitate to call for verification.  Electronically signed by: Felix Pacini, DO [facility] [place]

## 2022-09-23 NOTE — Patient Instructions (Signed)
No follow-ups on file.        Great to see you today.    If labs were collected, we will inform you of lab results once received either by echart message or telephone call.   - echart message- for normal results that have been seen by the patient already.   - telephone call: abnormal results or if patient has not viewed results in their echart.  Health Maintenance, Female Adopting a healthy lifestyle and getting preventive care are important in promoting health and wellness. Ask your health care provider about: The right schedule for you to have regular tests and exams. Things you can do on your own to prevent diseases and keep yourself healthy. What should I know about diet, weight, and exercise? Eat a healthy diet  Eat a diet that includes plenty of vegetables, fruits, low-fat dairy products, and lean protein. Do not eat a lot of foods that are high in solid fats, added sugars, or sodium. Maintain a healthy weight Body mass index (BMI) is used to identify weight problems. It estimates body fat based on height and weight. Your health care provider can help determine your BMI and help you achieve or maintain a healthy weight. Get regular exercise Get regular exercise. This is one of the most important things you can do for your health. Most adults should: Exercise for at least 150 minutes each week. The exercise should increase your heart rate and make you sweat (moderate-intensity exercise). Do strengthening exercises at least twice a week. This is in addition to the moderate-intensity exercise. Spend less time sitting. Even light physical activity can be beneficial. Watch cholesterol and blood lipids Have your blood tested for lipids and cholesterol at 44 years of age, then have this test every 5 years. Have your cholesterol levels checked more often if: Your lipid or cholesterol levels are high. You are older than 44 years of age. You are at high risk for heart disease. What  should I know about cancer screening? Depending on your health history and family history, you may need to have cancer screening at various ages. This may include screening for: Breast cancer. Cervical cancer. Colorectal cancer. Skin cancer. Lung cancer. What should I know about heart disease, diabetes, and high blood pressure? Blood pressure and heart disease High blood pressure causes heart disease and increases the risk of stroke. This is more likely to develop in people who have high blood pressure readings or are overweight. Have your blood pressure checked: Every 3-5 years if you are 34-33 years of age. Every year if you are 21 years old or older. Diabetes Have regular diabetes screenings. This checks your fasting blood sugar level. Have the screening done: Once every three years after age 50 if you are at a normal weight and have a low risk for diabetes. More often and at a younger age if you are overweight or have a high risk for diabetes. What should I know about preventing infection? Hepatitis B If you have a higher risk for hepatitis B, you should be screened for this virus. Talk with your health care provider to find out if you are at risk for hepatitis B infection. Hepatitis C Testing is recommended for: Everyone born from [birth year] through 1967. Anyone with known risk factors for hepatitis C. Sexually transmitted infections (STIs) Get screened for STIs, including gonorrhea and chlamydia, if: You are sexually active and are younger than 44 years of age. You are older than 44 years of age and  your health care provider tells you that you are at risk for this type of infection. Your sexual activity has changed since you were last screened, and you are at increased risk for chlamydia or gonorrhea. Ask your health care provider if you are at risk. Ask your health care provider about whether you are at high risk for HIV. Your health care provider may recommend a prescription medicine  to help prevent HIV infection. If you choose to take medicine to prevent HIV, you should first get tested for HIV. You should then be tested every 3 months for as long as you are taking the medicine. Pregnancy If you are about to stop having your period (premenopausal) and you may become pregnant, seek counseling before you get pregnant. Take 400 to 800 micrograms (mcg) of folic acid every day if you become pregnant. Ask for birth control (contraception) if you want to prevent pregnancy. Osteoporosis and menopause Osteoporosis is a disease in which the bones lose minerals and strength with aging. This can result in bone fractures. If you are 48 years old or older, or if you are at risk for osteoporosis and fractures, ask your health care provider if you should: Be screened for bone loss. Take a calcium or vitamin D supplement to lower your risk of fractures. Be given hormone replacement therapy (HRT) to treat symptoms of menopause. Follow these instructions at home: Alcohol use Do not drink alcohol if: Your health care provider tells you not to drink. You are pregnant, may be pregnant, or are planning to become pregnant. If you drink alcohol: Limit how much you have to: 0-1 drink a day. Know how much alcohol is in your drink. In the [place] one drink equals one 12 oz bottle of beer (355 mL), one 5 oz glass of wine (148 mL), or one 1 oz glass of hard liquor (44 mL). Lifestyle Do not use any products that contain nicotine or tobacco. These products include cigarettes, chewing tobacco, and vaping devices, such as e-cigarettes. If you need help quitting, ask your health care provider. Do not use street drugs. Do not share needles. Ask your health care provider for help if you need support or information about quitting drugs. General instructions Schedule regular health, dental, and eye exams. Stay current with your vaccines. Tell your health care provider if: You often feel depressed. You  have ever been abused or do not feel safe at home. Summary Adopting a healthy lifestyle and getting preventive care are important in promoting health and wellness. Follow your health care provider's instructions about healthy diet, exercising, and getting tested or screened for diseases. Follow your health care provider's instructions on monitoring your cholesterol and blood pressure. This information is not intended to replace advice given to you by your health care provider. Make sure you discuss any questions you have with your health care provider. Document Revised: 01/21/2021 Document Reviewed: 01/21/2021 Elsevier Patient Education  2023 [employer].

## 2022-09-24 LAB — HEPATITIS C ANTIBODY: Hepatitis C Ab: NONREACTIVE

## 2023-08-27 LAB — HM MAMMOGRAPHY

## 2023-09-28 ENCOUNTER — Encounter: Payer: Self-pay | Admitting: Family Medicine

## 2023-09-28 ENCOUNTER — Ambulatory Visit (INDEPENDENT_AMBULATORY_CARE_PROVIDER_SITE_OTHER): Payer: 59 | Admitting: Family Medicine

## 2023-09-28 ENCOUNTER — Encounter: Payer: 59 | Admitting: Family Medicine

## 2023-09-28 VITALS — BP 110/80 | HR 72 | Temp 97.8°F | Ht 62.8 in | Wt 157.8 lb

## 2023-09-28 DIAGNOSIS — Z13220 Encounter for screening for lipoid disorders: Secondary | ICD-10-CM | POA: Diagnosis not present

## 2023-09-28 DIAGNOSIS — Z23 Encounter for immunization: Secondary | ICD-10-CM

## 2023-09-28 DIAGNOSIS — Z131 Encounter for screening for diabetes mellitus: Secondary | ICD-10-CM | POA: Diagnosis not present

## 2023-09-28 DIAGNOSIS — Z0000 Encounter for general adult medical examination without abnormal findings: Secondary | ICD-10-CM | POA: Diagnosis not present

## 2023-09-28 LAB — CBC
HCT: 42.4 % (ref 36.0–46.0)
Hemoglobin: 14 g/dL (ref 12.0–15.0)
MCHC: 33 g/dL (ref 30.0–36.0)
MCV: 92.8 fL (ref 78.0–100.0)
Platelets: 242 10*3/uL (ref 150.0–400.0)
RBC: 4.57 Mil/uL (ref 3.87–5.11)
RDW: 13.9 % (ref 11.5–15.5)
WBC: 3.7 10*3/uL — ABNORMAL LOW (ref 4.0–10.5)

## 2023-09-28 LAB — COMPREHENSIVE METABOLIC PANEL
ALT: 35 U/L (ref 0–35)
AST: 26 U/L (ref 0–37)
Albumin: 4.5 g/dL (ref 3.5–5.2)
Alkaline Phosphatase: 56 U/L (ref 39–117)
BUN: 20 mg/dL (ref 6–23)
CO2: 28 meq/L (ref 19–32)
Calcium: 9.4 mg/dL (ref 8.4–10.5)
Chloride: 103 meq/L (ref 96–112)
Creatinine, Ser: 0.91 mg/dL (ref 0.40–1.20)
GFR: 76.92 mL/min (ref >60.00)
Glucose, Bld: 87 mg/dL (ref 70–99)
Potassium: 4.6 meq/L (ref 3.5–5.1)
Sodium: 138 meq/L (ref 135–145)
Total Bilirubin: 0.6 mg/dL (ref 0.2–1.2)
Total Protein: 6.7 g/dL (ref 6.0–8.3)

## 2023-09-28 LAB — TSH: TSH: 1.69 u[IU]/mL (ref 0.35–5.50)

## 2023-09-28 NOTE — Progress Notes (Signed)
 Patient ID: Angela Cooley, female  DOB: [birth year], 44 y.o.   MRN: [id number] Patient Care Team    Relationship Specialty Notifications Start End  Catherine Charlies LABOR, DO PCP - General Family Medicine  09/23/22   Abigail Maude POUR  Optometry  09/23/22   Mat Browning, MD Consulting Physician Obstetrics and Gynecology  09/23/22     Chief Complaint  Patient presents with   Annual Exam    Pt is not fasting    Subjective:  Angela Cooley is a 43 y.o.  female present for annual physical exam All past medical history, surgical history, allergies, family history, immunizations, medications and social history were updated in the electronic medical record today. All recent labs, ED visits and hospitalizations within the last year were reviewed.  Health maintenance:  Colon cancer screening: no fhx. Routine screen at 43 Breast cancer screening: Mammogram completed:2024- Dr. Celesta- records requested Cervical cancer screening: last pap: 2023- Dr. Celesta- records requested Immunizations: tdap-out of stock>nurse visit, Influenza-declined (encouraged yearly) Infectious disease screening: HIV completed with pregnancy, Hep C completed DEXA: routine screen Patient has a Dental home. Hospitalizations/ED visits: Reviewed     09/28/2023    9:09 AM 09/23/2022   10:25 AM 04/02/2018    8:30 AM 12/30/2017    3:15 PM  Depression screen PHQ 2/9  Decreased Interest 0 0 0 0  Down, Depressed, Hopeless 0 0 0 0  PHQ - 2 Score 0 0 0 0  Altered sleeping   0 0  Tired, decreased energy   0 0  Change in appetite   0 0  Feeling bad or failure about yourself    0 0  Trouble concentrating   0 0  Moving slowly or fidgety/restless   0 0  Suicidal thoughts   0 0  PHQ-9 Score   0 0  Difficult doing work/chores   Not difficult at all Not difficult at all       No data to display                09/28/2023    9:09 AM 12/30/2017    3:16 PM  Fall Risk   Falls in the past year? 0 No  Number falls in past yr: 0    Injury with Fall? 0   Risk for fall due to : No Fall Risks   Follow up Falls evaluation completed     Immunization History  Administered Date(s) Administered   Unspecified SARS-COV-2 Vaccination 05/01/2020   No results found.  Past Medical History:  Diagnosis Date   Asthma    CHEST PAIN 11/15/2010   Qualifier: Diagnosis of   By: Mahlon MD, [place]         Chicken pox    Gastro-esophageal reflux disease without esophagitis 11/15/2010   Qualifier: Diagnosis of  By: Mahlon MD, [place]     GERD (gastroesophageal reflux disease)    No Known Allergies Past Surgical History:  Procedure Laterality Date   CESAREAN SECTION  2011   x2 (2007 and 2011)   Family History  Problem Relation Age of Onset   Hypertension Mother    Arthritis Mother    Breast cancer Mother    Heart disease Maternal Grandmother    Arthritis Maternal Grandmother    Arthritis Maternal Grandfather    Melanoma Maternal Grandfather    Stroke Paternal Grandmother    Social History   Social History Narrative   Marital status/children/pets: Married, 2 children.   Education/employment:  Bachelor's of science degree.  Works as a architect.   Safety:      -smoke alarm in the home:Yes     - wears seatbelt: Yes     - Feels safe in their relationships: Yes       Allergies as of 09/28/2023   No Known Allergies      Medication List        Accurate as of September 28, 2023  9:21 AM. If you have any questions, ask your nurse or doctor.          STOP taking these medications    ALPRAZolam  0.5 MG tablet Commonly known as: Xanax  Stopped by: Charlies Bellini   pantoprazole  40 MG tablet Commonly known as: PROTONIX  Stopped by: Charlies Bellini       TAKE these medications    Intrarosa 6.5 MG Inst Generic drug: Prasterone INSERT 1 VAGINAL INSERT BY VAGINAL ROUTE EVERY DAY        All past medical history, surgical history, allergies, family history, immunizations andmedications were updated  in the EMR today and reviewed under the history and medication portions of their EMR.    No results found for this or any previous visit (from the past 2160 hours).  ROS 14 pt review of systems performed and negative (unless mentioned in an HPI)  Objective: BP 110/80   Pulse 72   Temp 97.8 F (36.6 C)   Ht 5' 2.8 (1.595 m)   Wt 157 lb 12.8 oz (71.6 kg)   LMP 09/14/2023 (Approximate)   SpO2 98%   BMI 28.14 kg/m  Physical Exam Vitals and nursing note reviewed.  Constitutional:      General: She is not in acute distress.    Appearance: Normal appearance. She is not ill-appearing or toxic-appearing.  HENT:     Head: Normocephalic and atraumatic.     Right Ear: Tympanic membrane, ear canal and external ear normal. There is no impacted cerumen.     Left Ear: Tympanic membrane, ear canal and external ear normal. There is no impacted cerumen.     Nose: No congestion or rhinorrhea.     Mouth/Throat:     Mouth: Mucous membranes are moist.     Pharynx: Oropharynx is clear. No oropharyngeal exudate or posterior oropharyngeal erythema.  Eyes:     General: No scleral icterus.       Right eye: No discharge.        Left eye: No discharge.     Extraocular Movements: Extraocular movements intact.     Conjunctiva/sclera: Conjunctivae normal.     Pupils: Pupils are equal, round, and reactive to light.  Cardiovascular:     Rate and Rhythm: Normal rate and regular rhythm.     Pulses: Normal pulses.     Heart sounds: Normal heart sounds. No murmur heard.    No friction rub. No gallop.  Pulmonary:     Effort: Pulmonary effort is normal. No respiratory distress.     Breath sounds: Normal breath sounds. No stridor. No wheezing, rhonchi or rales.  Chest:     Chest wall: No tenderness.  Abdominal:     General: Abdomen is flat. Bowel sounds are normal. There is no distension.     Palpations: Abdomen is soft. There is no mass.     Tenderness: There is no abdominal tenderness. There is no right  CVA tenderness, left CVA tenderness, guarding or rebound.     Hernia: No hernia is present.  Musculoskeletal:  General: No swelling, tenderness or deformity. Normal range of motion.     Cervical back: Normal range of motion and neck supple. No rigidity or tenderness.     Right lower leg: No edema.     Left lower leg: No edema.  Lymphadenopathy:     Cervical: No cervical adenopathy.  Skin:    General: Skin is warm and dry.     Coloration: Skin is not jaundiced or pale.     Findings: No bruising, erythema, lesion or rash.  Neurological:     General: No focal deficit present.     Mental Status: She is alert and oriented to person, place, and time. Mental status is at baseline.     Cranial Nerves: No cranial nerve deficit.     Sensory: No sensory deficit.     Motor: No weakness.     Coordination: Coordination normal.     Gait: Gait normal.     Deep Tendon Reflexes: Reflexes normal.  Psychiatric:        Mood and Affect: Mood normal.        Behavior: Behavior normal.        Thought Content: Thought content normal.        Judgment: Judgment normal.     Assessment/plan: Angela Cooley is a 43 y.o. female present for annual physical exam Routine general medical examination at a health care facility Patient was encouraged to exercise greater than 150 minutes a week. Patient was encouraged to choose a diet filled with fresh fruits and vegetables, and lean meats. AVS provided to patient today for education/recommendation on gender specific health and safety maintenance. Colon cancer screening: no fhx. Routine screen at 43 Breast cancer screening: Mammogram completed:2024- Dr. Celesta- records requested Cervical cancer screening: last pap: 2023- Dr. Celesta- records requested Immunizations: tdap-out of stock> nurse visit, Influenza-declined (encouraged yearly) Infectious disease screening: HIV completed with pregnancy, Hep C completed DEXA: routine screen   Return in about 1 year  (around 09/28/2024) for cpe (20 min).  Orders Placed This Encounter  Procedures   Tdap vaccine greater than or equal to 7yo IM   CBC   Comprehensive metabolic panel   TSH   No orders of the defined types were placed in this encounter.  Referral Orders  No referral(s) requested today     Note is dictated utilizing voice recognition software. Although note has been proof read prior to signing, occasional typographical errors still can be missed. If any questions arise, please do not hesitate to call for verification.  Electronically signed by: Charlies Bellini, DO [facility] [place]

## 2023-09-28 NOTE — Patient Instructions (Addendum)

## 2023-10-07 ENCOUNTER — Ambulatory Visit: Payer: 59

## 2024-08-15 ENCOUNTER — Telehealth: Payer: Self-pay

## 2024-08-15 NOTE — Telephone Encounter (Signed)
 LM for pt to return call to discuss.

## 2024-08-15 NOTE — Telephone Encounter (Signed)
 This vaccine is note carried in our office but TYPHIM VI VACC PFS LLK 1 can be ordered if needed

## 2024-08-15 NOTE — Telephone Encounter (Signed)
 If desiring injected vaccine she can go to health department or see if pharmacy carries.  We do not carry that vaccine.  There is an oral form, but it is >One capsule is taken every other day, for a total of 4 capsules. The last dose should be taken at least 1 week before travel. If she is traveling next week this is not ideal.   If still desiring oral vaccine please schedule her for an appt to discuss.

## 2024-08-15 NOTE — Telephone Encounter (Signed)
 Copied from CRM #[id number]. Topic: Clinical - Medical Advice >> Aug 15, 2024  8:55 AM Brittany M wrote: Reason for CRM: Traveling internationally next week- wanting to speak about getting a typhoid vaccination- please call patient

## 2024-08-17 NOTE — Telephone Encounter (Signed)
 LM for pt to return call to discuss.

## 2024-10-25 ENCOUNTER — Encounter: Payer: 59 | Admitting: Family Medicine
